# Patient Record
Sex: Female | Born: 1980 | Race: White | Hispanic: No | Marital: Married | Smoking: Never smoker
Health system: Southern US, Community
[De-identification: ages and names within clinical notes are randomized; demographics above are authoritative.]

## PROBLEM LIST (undated history)

## (undated) DIAGNOSIS — K219 Gastro-esophageal reflux disease without esophagitis: Secondary | ICD-10-CM

## (undated) DIAGNOSIS — G43909 Migraine, unspecified, not intractable, without status migrainosus: Secondary | ICD-10-CM

## (undated) DIAGNOSIS — S0340XA Sprain of jaw, unspecified side, initial encounter: Secondary | ICD-10-CM

## (undated) HISTORY — PX: CHOLECYSTECTOMY: SHX55

## (undated) HISTORY — DX: Gastro-esophageal reflux disease without esophagitis: K21.9

## (undated) HISTORY — PX: APPENDECTOMY: SHX54

## (undated) HISTORY — DX: Migraine, unspecified, not intractable, without status migrainosus: G43.909

## (undated) HISTORY — PX: LAPAROSCOPIC HYSTERECTOMY: SHX1926

---

## 2020-05-23 ENCOUNTER — Other Ambulatory Visit: Payer: Self-pay | Admitting: Family Medicine

## 2020-05-23 ENCOUNTER — Other Ambulatory Visit: Payer: Self-pay

## 2020-05-23 ENCOUNTER — Ambulatory Visit
Admission: RE | Admit: 2020-05-23 | Discharge: 2020-05-23 | Disposition: A | Discharge status: Home / Self Care | Payer: BC Managed Care – PPO | Source: Ambulatory Visit | Attending: Family Medicine | Admitting: Family Medicine

## 2020-05-23 DIAGNOSIS — M546 Pain in thoracic spine: Secondary | ICD-10-CM

## 2020-05-23 IMAGING — CR DG THORACIC SPINE 3V
3 series · 3 of 3 positions shown · non-contrast
Comparison: None.

CLINICAL DATA: 38-year-old female with mid back pain for 5 months.
No known injury.

EXAM:
THORACIC SPINE - 3 VIEWS

[w thoracic spine ap]
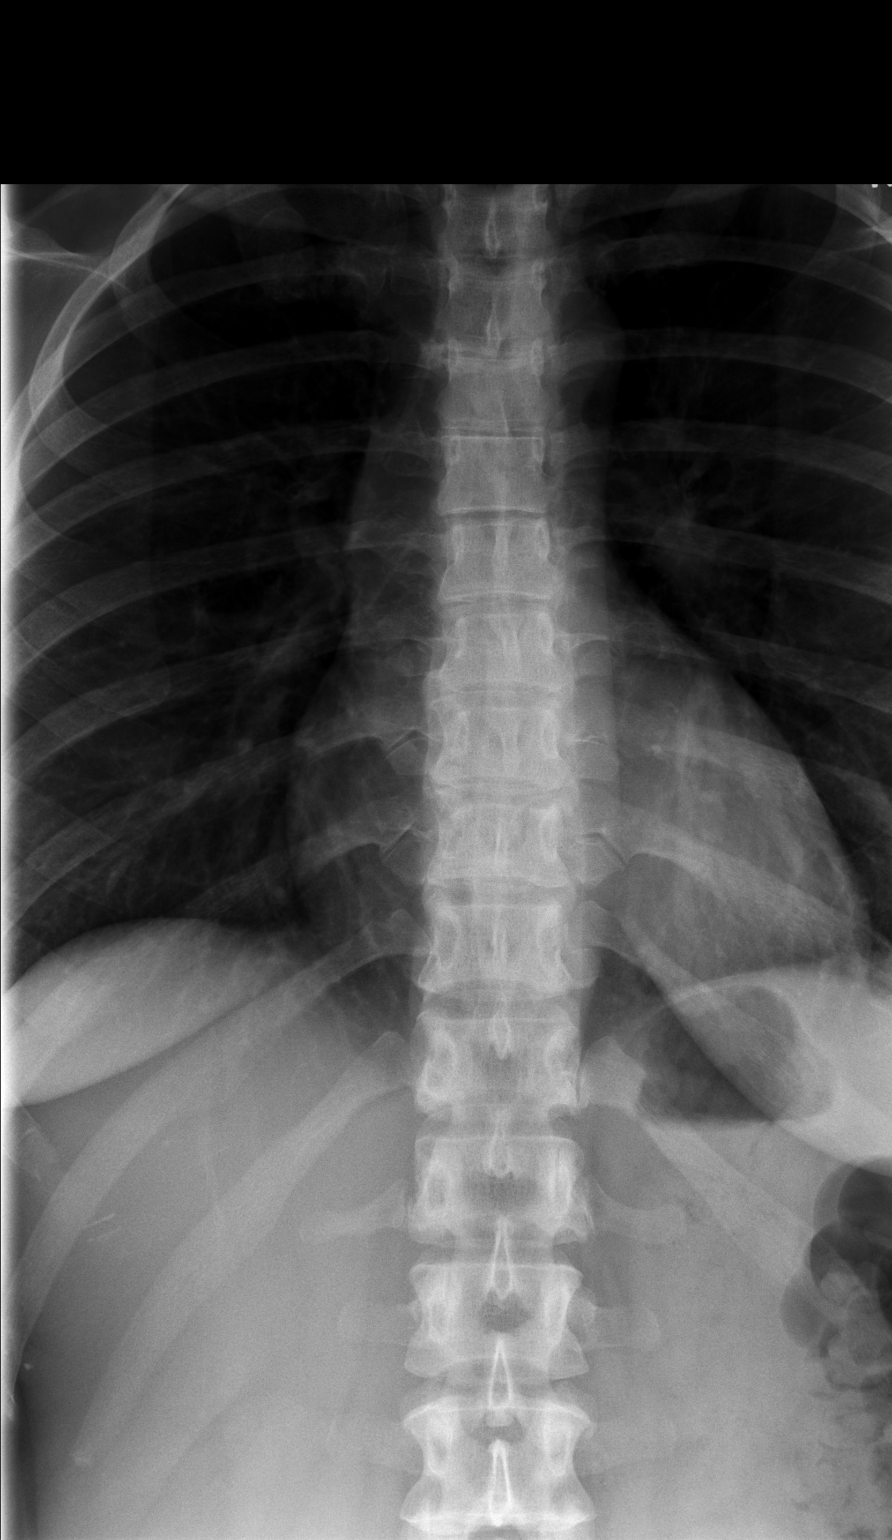

[w thoracic spine lat]
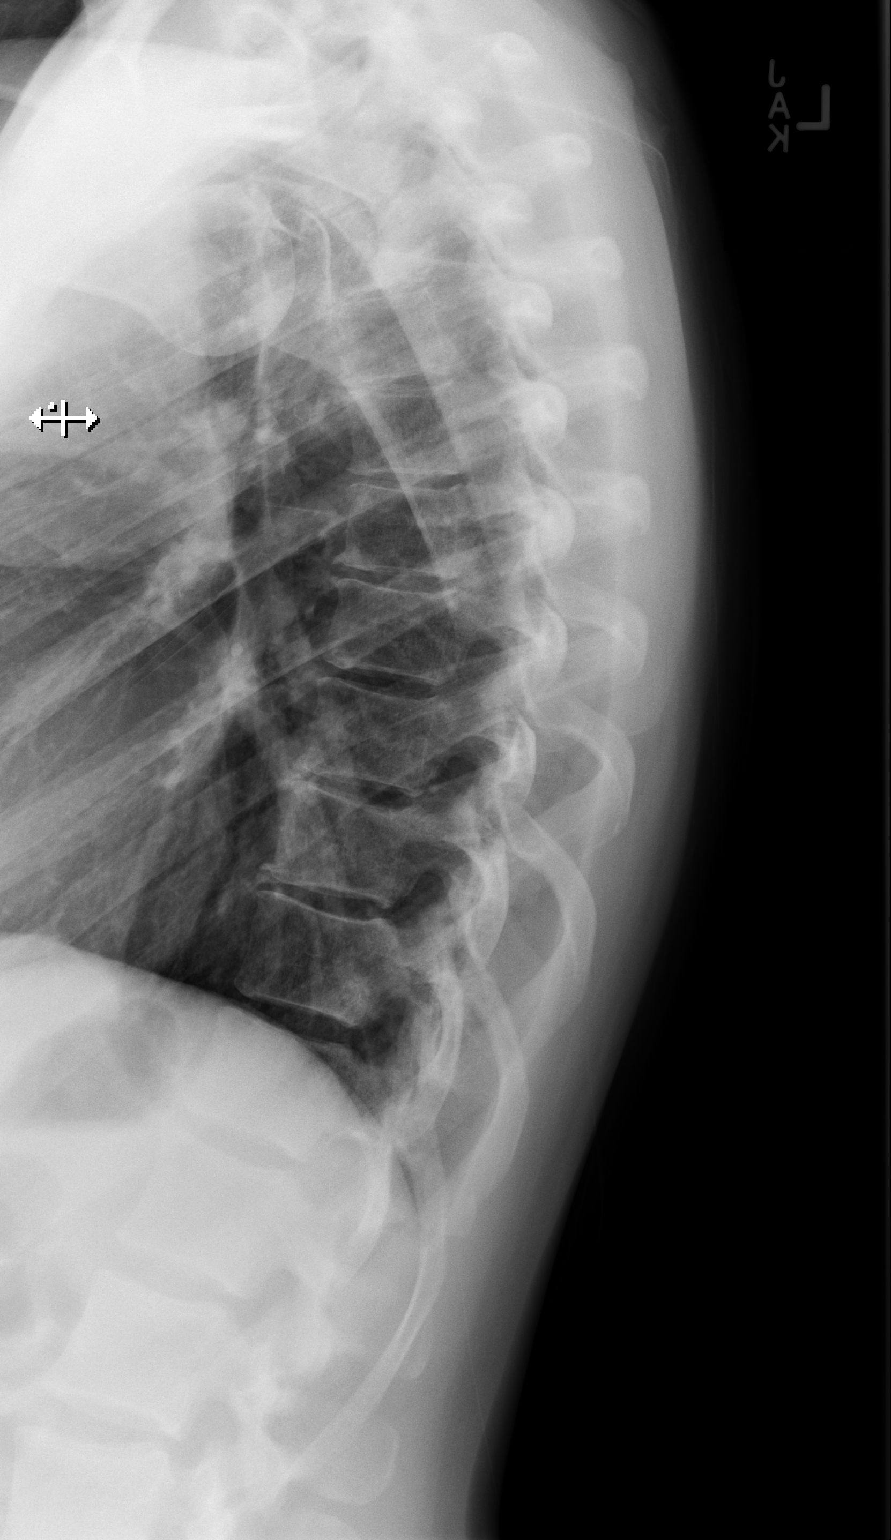

[w thoracic swimmers]
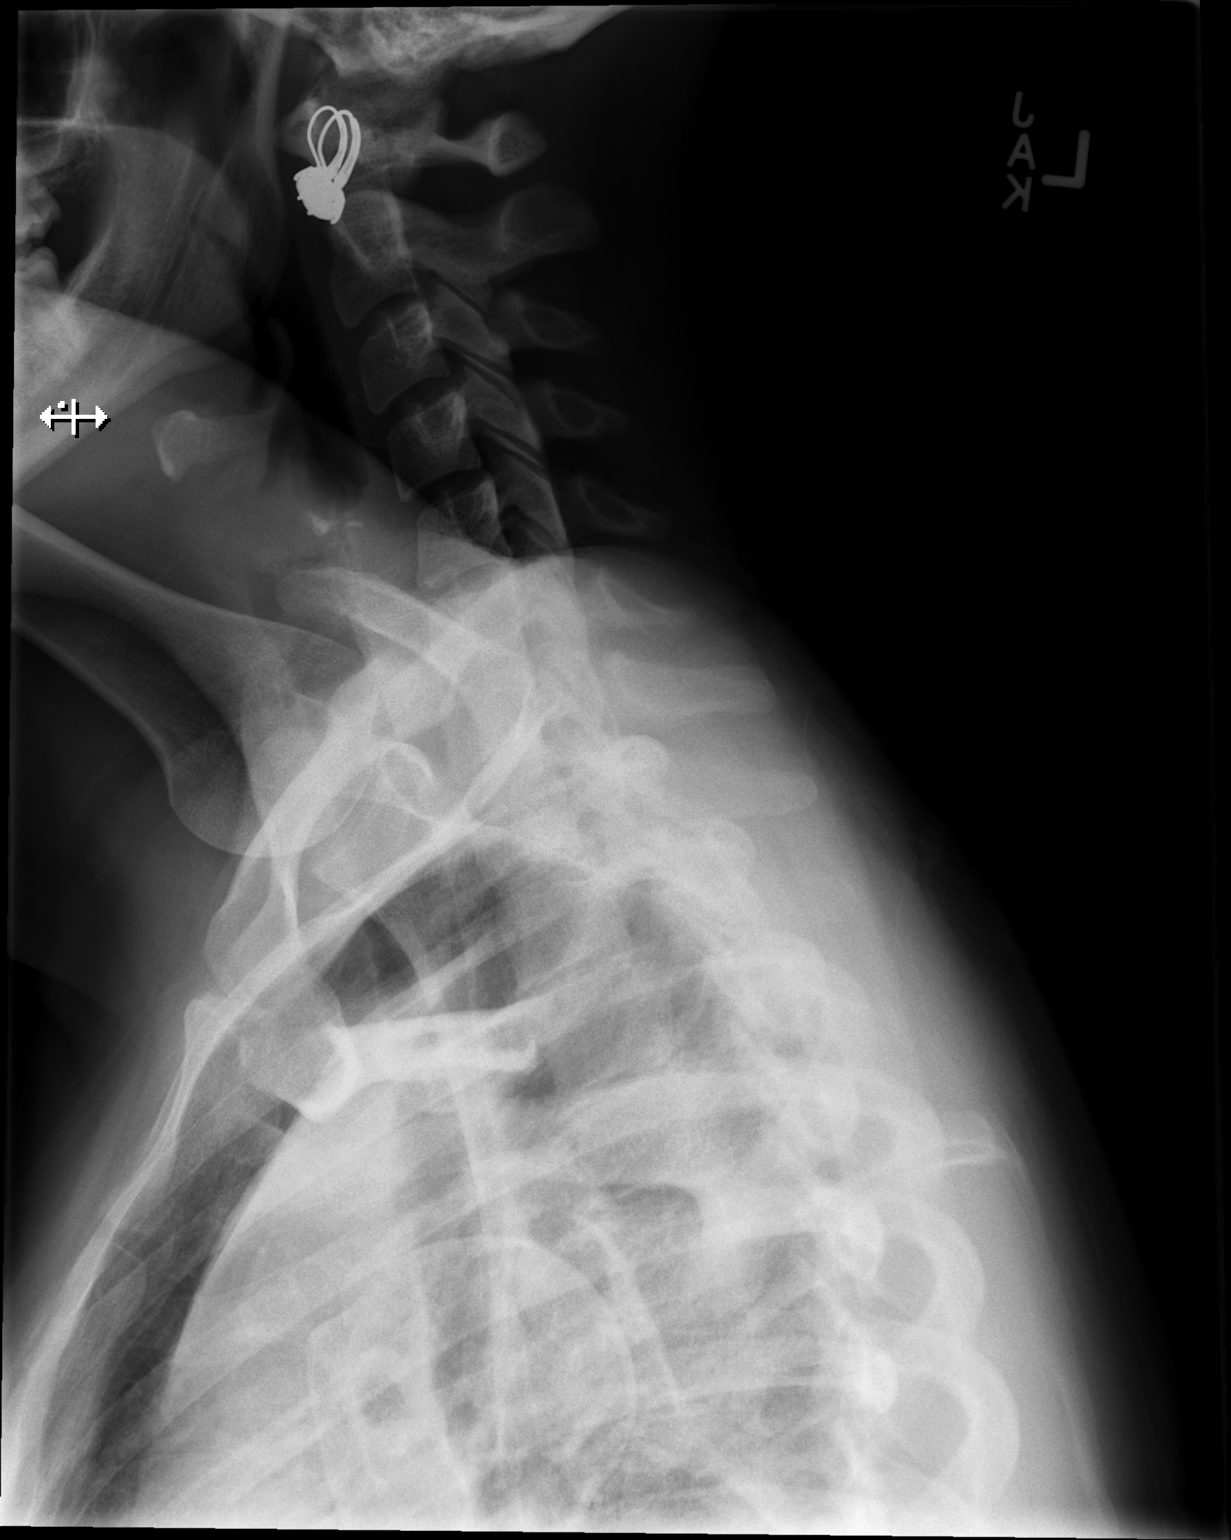

[3 of 3 positions shown; findings below may reference images not displayed]

FINDINGS: Twelve full sized pairs of ribs. Vestigial ribs versus ununited
transverse process ossification centers at L1. Bone mineralization
is within normal limits. Normal thoracic vertebral height and
alignment. Anterior thoracic endplate spurring intermittently noted,
most pronounced at the T9-T10. No acute osseous abnormality
identified. Posterior ribs appear intact. Negative visible chest and
upper abdominal visceral contours.
IMPRESSION: 1. No acute osseous abnormality identified in the thoracic spine.
2. Intermittent anterior thoracic vertebral endplate degeneration
and spurring.

## 2020-06-11 ENCOUNTER — Other Ambulatory Visit: Payer: Self-pay | Admitting: Family Medicine

## 2020-06-11 DIAGNOSIS — R1310 Dysphagia, unspecified: Secondary | ICD-10-CM

## 2020-06-11 DIAGNOSIS — J029 Acute pharyngitis, unspecified: Secondary | ICD-10-CM

## 2020-06-14 ENCOUNTER — Ambulatory Visit
Admission: RE | Admit: 2020-06-14 | Discharge: 2020-06-14 | Disposition: A | Discharge status: Home / Self Care | Payer: BC Managed Care – PPO | Source: Ambulatory Visit | Attending: Family Medicine | Admitting: Family Medicine

## 2020-06-14 DIAGNOSIS — R1310 Dysphagia, unspecified: Secondary | ICD-10-CM

## 2020-06-14 DIAGNOSIS — J029 Acute pharyngitis, unspecified: Secondary | ICD-10-CM

## 2020-06-14 IMAGING — RF DG ESOPHAGUS
8 series · 14 of 24 positions shown · non-contrast
Comparison: None.

CLINICAL DATA: Dysphagia.  History of upper endoscopy.

EXAM:
ESOPHOGRAM / BARIUM SWALLOW / BARIUM TABLET STUDY
TECHNIQUE: Combined double contrast and single contrast examination performed
using effervescent crystals, thick barium liquid, and thin barium
liquid. The patient was observed with fluoroscopy swallowing a 13 mm
barium sulphate tablet.
FLUOROSCOPY TIME:  Fluoroscopy Time: 1 minutes and 0 seconds of
low-dose pulsed fluoroscopy
Radiation Exposure Index (if provided by the fluoroscopic device):
30 mGy
Number of Acquired Spot Images: 0

[Series 1: sequence · 2 of 22 frames shown (1 of 6)]
[frame 4/22]
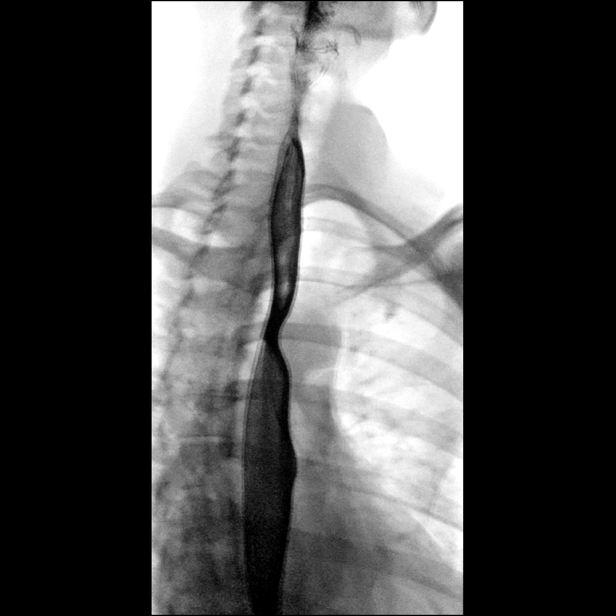
[frame 13/22]
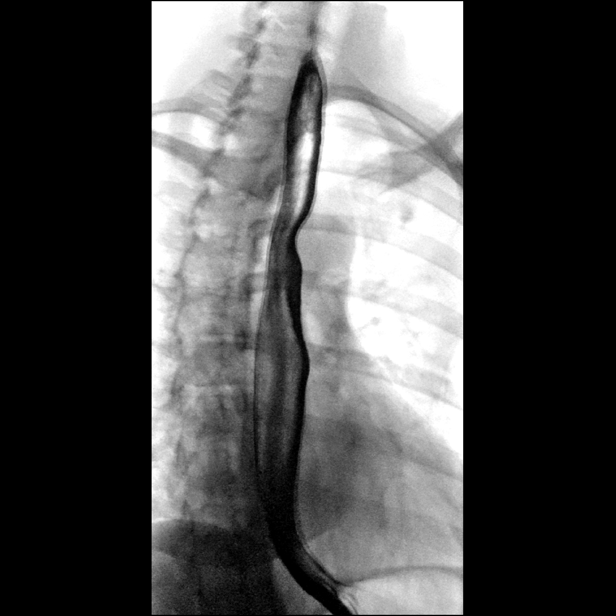

[Series 2: one shot · 1 of 1 slices shown (1 of 2)]
[im 1/1]
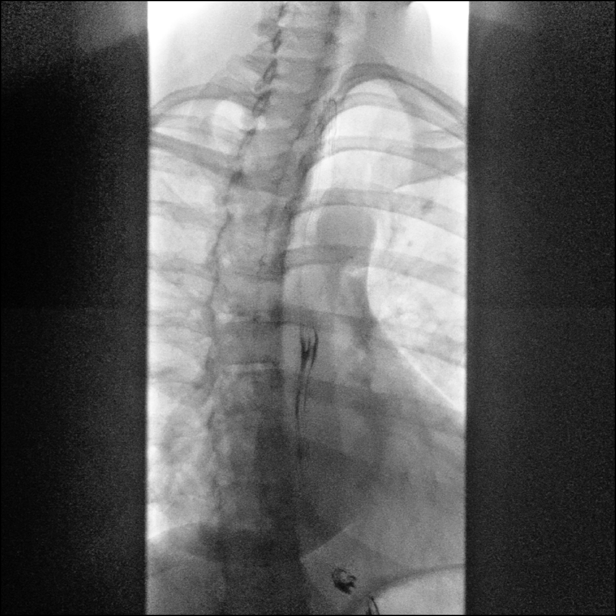

[Series 3: sequence · 1 of 9 frames shown (2 of 6)]
[frame 8/9]
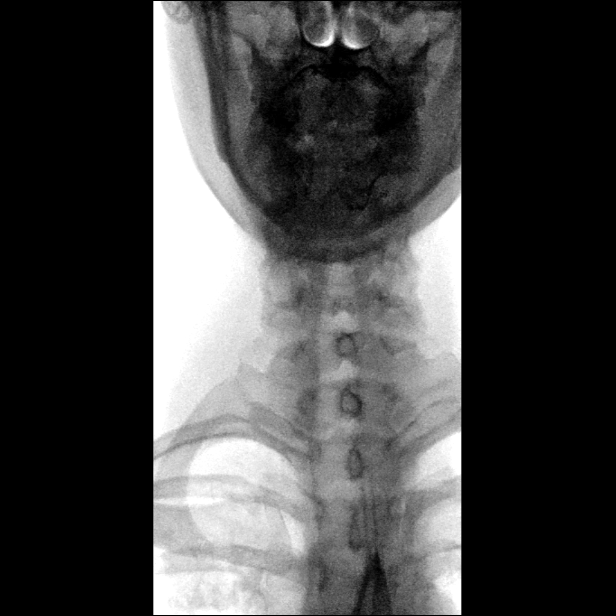

[Series 4: sequence · 2 of 8 frames shown (3 of 6)]
[frame 1/8]
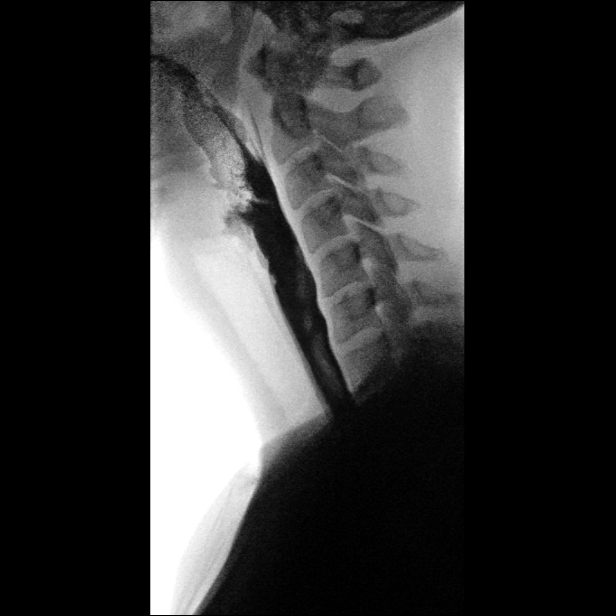
[frame 5/8]
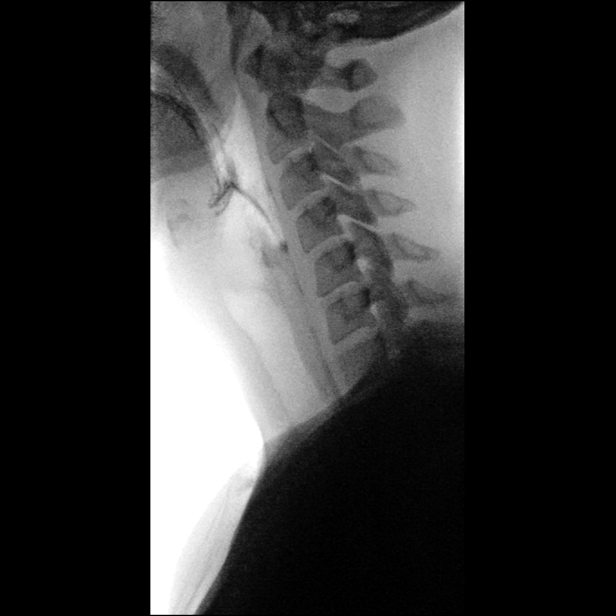

[Series 5: sequence · 3 of 21 frames shown (4 of 6)]
[frame 4/21]
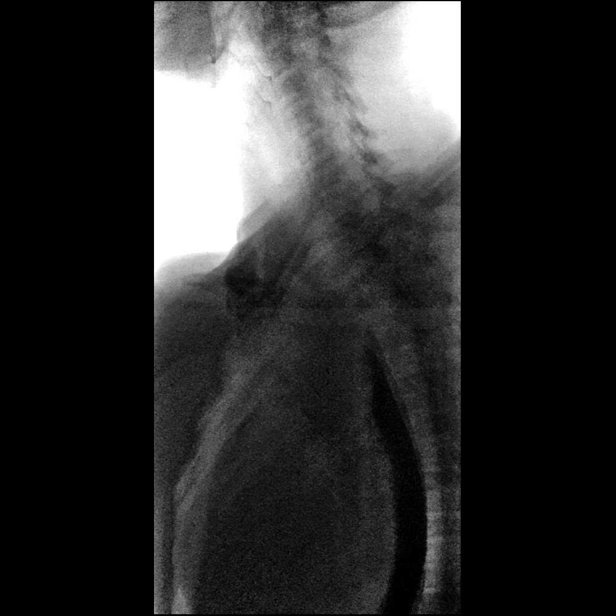
[frame 11/21]
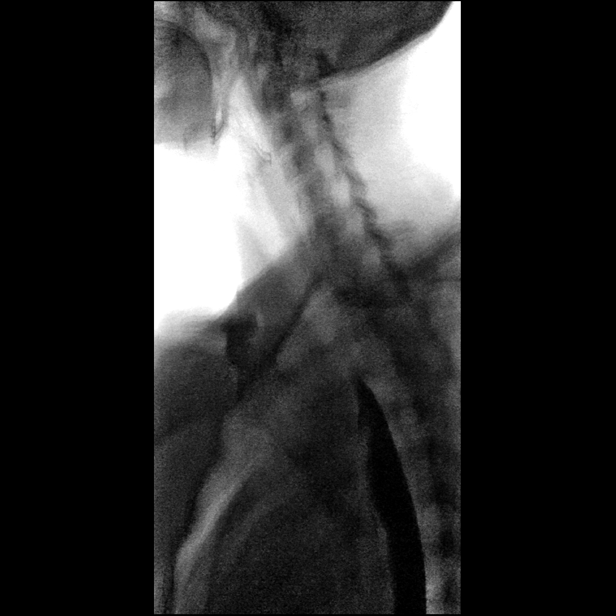
[frame 21/21  full-range]
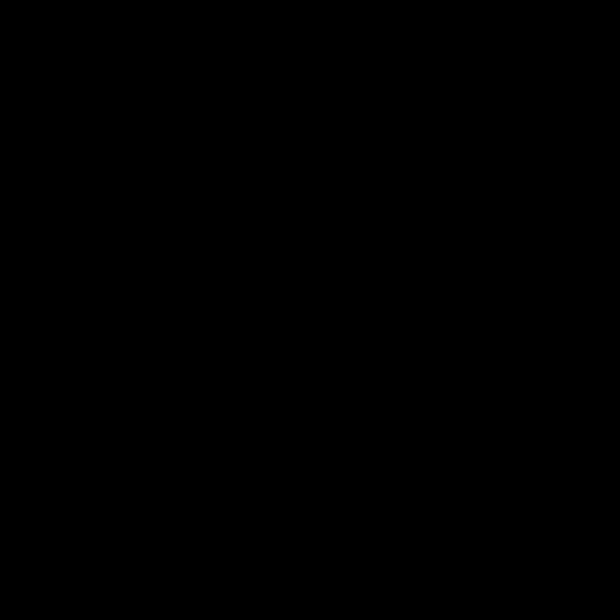

[Series 6: sequence · 1 of 13 frames shown (5 of 6)]
[frame 7/13]
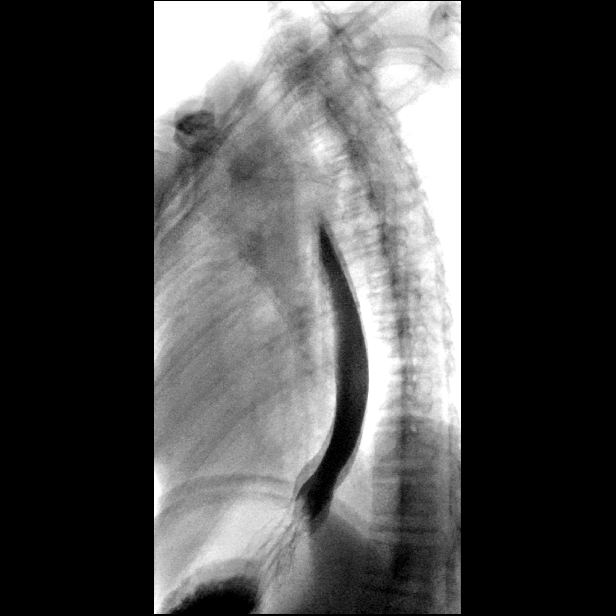

[Series 7: sequence · 3 of 6 frames shown (6 of 6)]
[frame 1/6]
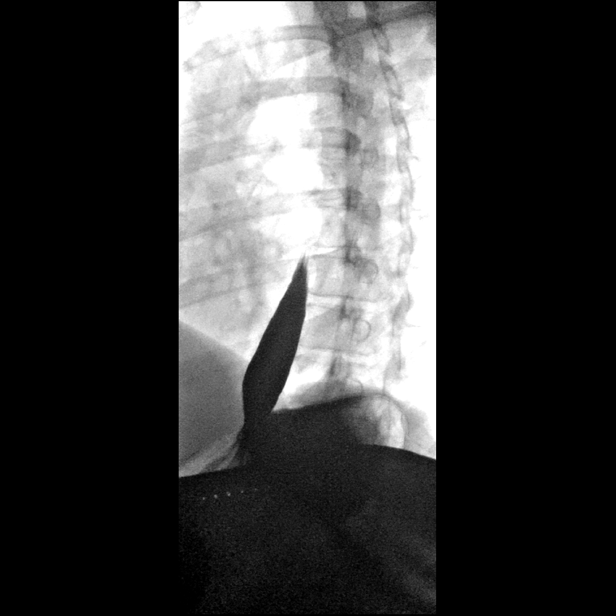
[frame 3/6  full-range]
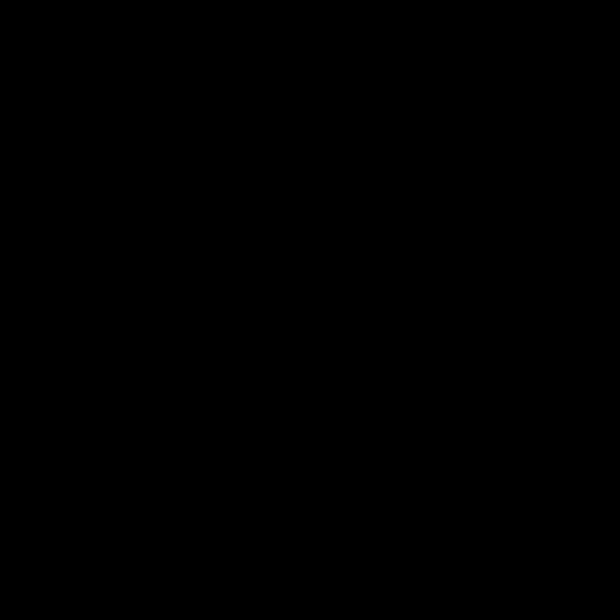
[frame 6/6  full-range]
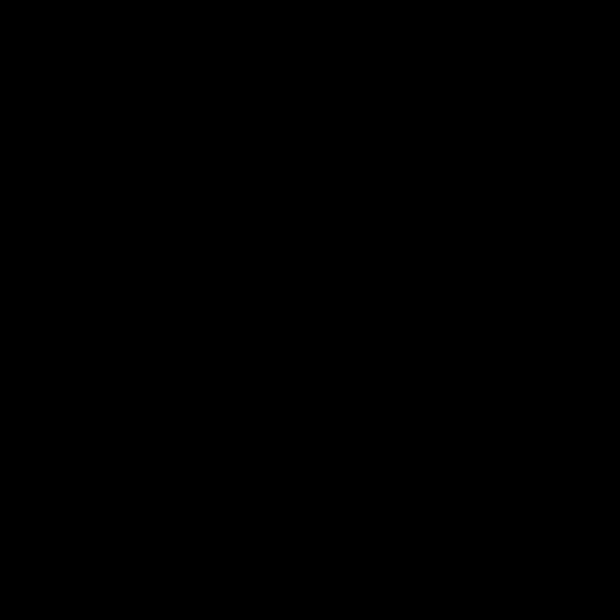

[Series 8: one shot · 1 of 2 slices shown (2 of 2)]
[im 2/2]
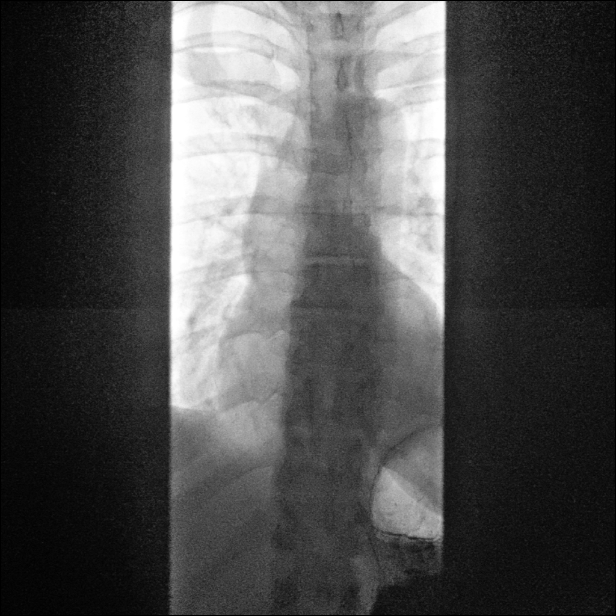

[14 of 24 positions shown; findings below may reference images not displayed]

FINDINGS: The esophageal motility is within normal limits. There is no
evidence of stricture, mass or ulceration. Rapid sequence imaging of
the pharynx in the AP and lateral projections demonstrates no
mucosal abnormalities.

There is no significant hiatal hernia. However, there was
gastroesophageal reflux to the level of the left atrium with the
water siphon test. At the conclusion of the study, a 13 mm barium
tablet was administered. This passed without delay into the stomach.
IMPRESSION: 1. Mild to moderate gastroesophageal reflux.
2. No evidence of esophagitis or stricture.

## 2020-06-20 ENCOUNTER — Telehealth: Payer: Self-pay | Admitting: Gastroenterology

## 2020-06-20 NOTE — Telephone Encounter (Signed)
Hi Dr Adela Lank,   We received a referral from PCP for Dysphagia and Genella Rife. Patient previously had Barium Swallowing Test on 06/14/20 and an EGD on 11/27/19 obtained operative and pathology report for you to review.   Please advise on scheduling.    Thank You!

## 2020-06-26 NOTE — Telephone Encounter (Signed)
Hi.  I had an opening come up tomorrow at 1010. Jan will put her in this spot. Misty Stanley can you see if she can do this? If not can you let everyone know on this message and we can try to find something else. Thanks

## 2020-06-26 NOTE — Telephone Encounter (Signed)
Hi Dr Adela Lank, you reviewed records and recommended an office visit for consult but there are no available appointments with you before the end of the year. Would it be ok to schedule this patient with an  APP?  Please advise, thank you!

## 2020-06-27 ENCOUNTER — Ambulatory Visit: Payer: BC Managed Care – PPO | Admitting: Gastroenterology

## 2020-06-27 ENCOUNTER — Encounter: Payer: Self-pay | Admitting: Gastroenterology

## 2020-06-27 NOTE — Telephone Encounter (Signed)
Called and spoke to pt.  Offered her an appt on 11-1 or 11-2 with Dr. Mervyn Skeeters at 4:00pm (hem banding spot).  She can't make those dates since she is having surgery pn Monday. I asked her if she would be willing to see a PA or NP to get in Sooner and she declined.  She is having reflux Sx but she indicated it is not urgent that she be seen.  She will take Dr. Roberts Gaudy next available appt. Per SA ok to put in banding spot since he has nothing available through December.  She is schedule for 12-7 at 4:00pm. Patient confirmed appt. Misty Stanley - please send new pt letter with paperwork. Thank you

## 2020-07-31 ENCOUNTER — Other Ambulatory Visit: Payer: Self-pay | Admitting: Family Medicine

## 2020-07-31 DIAGNOSIS — R519 Headache, unspecified: Secondary | ICD-10-CM

## 2020-08-06 ENCOUNTER — Encounter: Payer: Self-pay | Admitting: Gastroenterology

## 2020-08-06 ENCOUNTER — Ambulatory Visit: Payer: BC Managed Care – PPO | Admitting: Gastroenterology

## 2020-08-06 ENCOUNTER — Other Ambulatory Visit: Payer: BC Managed Care – PPO

## 2020-08-06 VITALS — BP 138/84 | Ht 66.0 in | Wt 156.0 lb

## 2020-08-06 DIAGNOSIS — R112 Nausea with vomiting, unspecified: Secondary | ICD-10-CM

## 2020-08-06 DIAGNOSIS — R198 Other specified symptoms and signs involving the digestive system and abdomen: Secondary | ICD-10-CM

## 2020-08-06 DIAGNOSIS — R0989 Other specified symptoms and signs involving the circulatory and respiratory systems: Secondary | ICD-10-CM

## 2020-08-06 DIAGNOSIS — K219 Gastro-esophageal reflux disease without esophagitis: Secondary | ICD-10-CM

## 2020-08-06 DIAGNOSIS — R194 Change in bowel habit: Secondary | ICD-10-CM

## 2020-08-06 MED ORDER — FAMOTIDINE 10 MG PO TABS: 10.0000 mg | ORAL_TABLET | Freq: Two times a day (BID) | ORAL | Status: AC

## 2020-08-06 MED ORDER — COLESTIPOL HCL 1 G PO TABS: ORAL_TABLET | ORAL | 1 refills | Status: DC

## 2020-08-06 NOTE — Progress Notes (Signed)
HPI :  39 y/o female with a history of GERD and migraine headaches, referred by by Dr. Mosetta Putt for suspected GERD, globus, vomiting, altered bowel habits.  Patient states she had severe abdominal pain and frequent nausea vomiting back in March.  At that point time she underwent a cholecystectomy in [facility].  Generally since having her cholecystectomy her pain went away and she is been able to eat much better and overall doing better.  She has had some symptoms however that have persisted.  She does have a history of globus, sense of something in her throat.  She denied much pyrosis or regurgitation with this but has been having some intermittent pain in her mid back and some waterbrash overnight.  She was tried on Protonix which did help resolve this symptom but she states she felt "sick" on it and did not like the way it made her feel.  She was then transition to AcipHex.  She states AcipHex likely worked well for her symptoms but she states her migraines got reliably worse on the medication so she also stopped that and self transition to Pepcid 10 mg in the morning a few weeks ago.  She thinks she is tolerating the Pepcid pretty well so far and so far this has resolved her globus.  She denies any pyrosis regurgitation or globus symptoms since being on the regimen.  She denies any dysphagia and is eating well.  One of her main complaints is awakening in the morning with nausea.  She states she feels sick to her stomach when she wakes up but does not vomit.  Over the course the day she states the nausea resolved and then she feels quite well and eats dinner without any problems.  She does not have any vomiting at all.  She really does not have any pain in her abdomen at all.  She does take Excedrin perhaps once or twice a month for headache.  Otherwise denies much use of any NSAIDs otherwise.  She does take a vitamin D supplement.  She had tried Zofran in the past for her symptoms which made her  dizzy.  She was given Reglan when in the hospital in the past and states she became very agitated with that.  She states she is rather sensitive to medications.  Since she has had her cholecystectomy she has had some alteration in her bowels.  She can have loose/yellow stools when she eats fatty foods, often with some urgency.  If she is not eating heavy or fatty foods she does not really have much problems with her bowels.  No blood in her stool she inquires about things she can do to allow her to eat these foods better without causing bowel symptoms.  She has had an extensive work-up for the symptoms since initially seen [facility] this past spring.  She had an EGD and colonoscopy as outlined below without any concerning pathology.  She had normal biopsies of her stomach and colon.  She had a CT scan done back in March which did not show any concerning pathology.  More recently she had a barium swallow on October 15 as below.  There is evidence of spontaneous reflux noted but normal motility no stricture appreciated.  She does endorse having 4 young girls and that can be stressful..  In general she has been doing much better in regards to her cholecystectomy results or abdominal pain.  Her globus is controlled with Pepcid, main issue is nausea  in the morning and altered bowel habits as outlined above when eating heavier foods.  Prior workup: Barium swallow 06/14/20 - normal motility, no stricture / stenosis / mass / ulceration. No significant hiatal hernia, but some "mild to moderate" reflux noted. Barium tablet passed into the stomach, no stricture  CT abdomen / pelvis with contrast 11/29/19 - radioopaque density up to 10cm in length from vaginal cuff to anterior L4-L5 disc, correlate with surgical report for postsurgical change vs. Retained foreign body  Colonoscopy 11/27/19 - normal colon and terminal ileum, internal / external hemorrhoids. Angulated turn in the rectosigmoid colon concerning for  adhesive disease, reached cecum using upper endoscope. Bioosies taken.   EGD 11/27/19 - normal exam- biopsies taken  Path - gastric - mild chronic inflammation, negative for HP and GIM, biopsies of the esophagus normal, biopsies of colon normal   Labs 05/21/20 -  BUN 10, Cr 0.55 T bil 0.7, AP 50, AST 10, ALT 10,  Amylase 28, lipase 9 WBC 6.0, Hgb 13.3, HCT 38.4, plt 197   Past Medical History:  Diagnosis Date  . GERD (gastroesophageal reflux disease)   . Migraines      Past Surgical History:  Procedure Laterality Date  . APPENDECTOMY    . CHOLECYSTECTOMY    . LAPAROSCOPIC HYSTERECTOMY     Family History  Problem Relation Age of Onset  . Diabetes Mother   . Diabetes Father    Social History   Tobacco Use  . Smoking status: Never Smoker  . Smokeless tobacco: Never Used  Substance Use Topics  . Alcohol use: Yes  . Drug use: Never   Current Outpatient Medications  Medication Sig Dispense Refill  . colestipol (COLESTID) 1 g tablet Take 1 gram daily and may increase to twice a day as needed. 90 tablet 1  . famotidine (PEPCID AC) 10 MG tablet Take 1 tablet (10 mg total) by mouth 2 (two) times daily.     No current facility-administered medications for this visit.   Allergies  Allergen Reactions  . Rocephin [Ceftriaxone] Anaphylaxis     Review of Systems: All systems reviewed and negative except where noted in HPI.   Labs per HPI  Physical Exam: BP 138/84   Ht 5\' 6"  (1.676 m)   Wt 156 lb (70.8 kg)   BMI 25.18 kg/m  Constitutional: Pleasant,well-developed, female in no acute distress. HEENT: Normocephalic and atraumatic. Conjunctivae are normal. No scleral icterus. Neck supple.  Cardiovascular: Normal rate, regular rhythm.  Pulmonary/chest: Effort normal and breath sounds normal. No wheezing, rales or rhonchi. Abdominal: Soft, nondistended, nontender. there are no masses palpable.  Extremities: no edema Lymphadenopathy: No cervical adenopathy  noted. Neurological: Alert and oriented to person place and time. Skin: Skin is warm and dry. No rashes noted. Psychiatric: Normal mood and affect. Behavior is normal.   ASSESSMENT AND PLAN: 39 year old female here for new patient assessment of the following:  GERD / globus / nausea - as above, she has a globus as her main presenting symptom of reflux as well as some waterbrash.  She has reliably responded well to antacids to control the symptom but unfortunately has had side effects with 2 PPIs thus far (Protonix and AcipHex).  She has transitioned to Pepcid and taking it once in the morning and that has essentially resolved her globus and she is doing much better in that regard.  Given her response to antacids it is very likely that her globus is caused by reflux.  We discussed options moving  forward.  Unclear what is causing her early AM nausea, perhaps that is related to nocturnal reflux.  We can try increasing her Pepcid to twice daily and see if that helps at all, she seems to tolerate that well.  We discussed trying her on some antiemetics again, she is rather hesitant to try any new medications.  If we are to try anything different in this light I would consider Phenergan nightly which may help her sleep better and prevent morning symptoms.  She wants to hold off on that for now.  We will see how she does on higher dose Pepcid and she can let me know in a few weeks how things are going.  I reassured her that her endoscopy, CT scan, and labs her unremarkable.  Given her nausea and bowel habit changes I will also screen her for celiac disease with labs although anticipate this will be negative given negative endoscopic report of her duodenum previously.  Moving forward, if her symptoms are due to reflux she really does not want to take medications chronically for this.  Hopefully she does well with H2 blocker but counseled her there is risk of tachyphylaxis with this and it may not work as well over  time.  We will see how she does over the next several weeks.  She inquires about alternative ways to manage her reflux, and I discussed TIF with her as well as surgery.  If she wanted to try to manage this without medications long-term, could consider evaluation for TIF, I provided her handout about this but would not recommend it now given she is just started a trial of Pepcid.  She agreed she can follow with me as needed for this issue.   Altered bowel habits post cholecystectomy - discussed how this can happen post cholecystectomy, fortunately it does not happen with everything she eats only heavy / greasy foods.  After discussion of options I will give her some Colestid to use 1 g once to twice daily as needed and see if this will help some of her urgent loose stools after she eats trigger foods.  She can use this as needed and follow-up as needed for this issue  Ileene Patrick, MD [facility]  CC: Mosetta Putt MD

## 2020-08-06 NOTE — Patient Instructions (Signed)
If you are age 37 or older, your body mass index should be between 23-30. Your Body mass index is 25.18 kg/m. If this is out of the aforementioned range listed, please consider follow up with your Primary Care Provider.  If you are age 58 or younger, your body mass index should be between 19-25. Your Body mass index is 25.18 kg/m. If this is out of the aformentioned range listed, please consider follow up with your Primary Care Provider.   Your provider has requested that you go to the basement level for lab work before leaving today. Press "B" on the elevator. The lab is located at the first door on the left as you exit the elevator.  Please take Pepcid 10 MG twice a day.  We have sent the following medications to your pharmacy for you to pick up at your convenience: Colestid 1 gram daily, may increase to twice a day as needed.  It was great seeing you today!  Thank you for entrusting me with your care and choosing [facility].  Dr. Adela Lank    Transoral Incisionless Fundoplication Transoral incisionless fundoplication (TIF) is a procedure to rebuild the valve between the stomach and esophagus. The esophagus is the part of the body that moves food from the mouth to the stomach. TIF is done to treat gastroesophageal reflux disease (GERD), a condition in which acid from your stomach flows up into your esophagus (reflux) and causes heartburn. You may have TIF if lifestyle changes and other medical treatments have not improved your condition. During this procedure, a long, flexible scope with a camera (endoscope) is passed through your mouth to the area where the stomach meets the esophagus. The endoscope projects images onto a screen. TIF does not involve surgical incisions or stitches (sutures). The valve is rebuilt with a device that is inserted through the endoscope. The rebuilt valve prevents reflux. Tell a health care provider about:  Any allergies you have.  All medicines  you are taking, including vitamins, herbs, eye drops, creams, and over-the-counter medicines.  Any problems you or family members have had with anesthetic medicines.  Any blood disorders you have.  Any surgeries you have had.  Any medical conditions you have.  Whether you are pregnant or may be pregnant. What are the risks? Generally, this is a safe procedure. However, problems may occur, including:  Bleeding.  Allergic reactions to medicines or dyes.  Damage to the stomach or esophagus.  Pain in the throat, shoulder, or chest.  Difficulty swallowing.  Gas and bloating.  Nausea and vomiting. Rarely, the procedure fails and does not relieve symptoms. What happens before the procedure? Staying hydrated Follow instructions from your health care provider about hydration, which may include:  Up to 2 hours before the procedure - you may continue to drink clear liquids, such as water, clear fruit juice, black coffee, and plain tea.  Eating and drinking restrictions Follow instructions from your health care provider about eating and drinking, which may include:  8 hours before the procedure - stop eating heavy meals or foods such as meat, fried foods, or fatty foods.  6 hours before the procedure - stop eating light meals or foods, such as toast or cereal.  6 hours before the procedure - stop eating, and drink only clear liquids.  2 hours before the procedure - stop drinking clear liquids. General instructions  Ask your health care provider about: ? Changing or stopping your regular medicines. This is especially important if you  are taking diabetes medicines or blood thinners. ? Taking medicines such as aspirin and ibuprofen. These medicines can thin your blood. Do not take these medicines unless your health care provider tells you to take them. ? Taking over-the-counter medicines, vitamins, herbs, and supplements.  You may have testing, such as: ? Tests to examine your  stomach with an endoscope. ? Imaging studies of your stomach and esophagus. ? Tests to measure acid levels in your stomach and esophagus.  Do not use any products that contain nicotine or tobacco, such as cigarettes and e-cigarettes. Smoking increases GERD and may delay your healing. Try to quit smoking before surgery. If you need help quitting, ask your health care provider.  Plan to have someone take you home from the hospital or clinic.  Plan to have a responsible adult care for you for at least 24 hours after you leave the hospital or clinic. This is important. What happens during the procedure?   To lower your risk of infection, your health care team will wash or sanitize their hands.  An IV will be inserted into one of your veins.  You will be given one or more of the following: ? A medicine to help you relax (sedative). ? A medicine to make you fall asleep (general anesthetic).  After you are asleep, the endoscope will be passed through your mouth and down your esophagus to your stomach.  To guide the movement of the endoscope during the procedure, your surgeon will watch images that are projected from the camera onto a screen.  When the endoscope reaches the opening between the stomach and esophagus, the device that creates a new valve will be inserted through the endoscope.  The device will rebuild the valve by using plastic fasteners to wrap a portion of the upper stomach around the esophagus. This new, narrower valve will prevent reflux.  The device and the endoscope will be removed when the procedure is complete. The procedure may vary among health care providers and hospitals. What happens after the procedure?  Your blood pressure, heart rate, breathing rate, and blood oxygen level will be monitored until the medicines you were given have worn off.  You will be given pain medicine.  You will start on a liquid diet at first.  You will be given home care instructions  when you are able to go home. Summary  TIF is a procedure to treat gastroesophageal reflux disease (GERD).  In this procedure, the valve between your stomach and esophagus is rebuilt to prevent stomach acid from flowing up into your esophagus (reflux).  This procedure is done using an endoscope, and it does not require incisions or stitches (sutures). This information is not intended to replace advice given to you by your health care provider. Make sure you discuss any questions you have with your health care provider. Document Revised: 07/05/2018 Document Reviewed: 06/21/2017 Elsevier Patient Education  2020 [employer].

## 2020-08-07 LAB — IGA: Immunoglobulin A: 283 mg/dL (ref 47–310)

## 2020-08-07 LAB — TISSUE TRANSGLUTAMINASE, IGA: (tTG) Ab, IgA: <1 U/mL

## 2020-09-04 ENCOUNTER — Other Ambulatory Visit: Payer: BC Managed Care – PPO

## 2020-10-06 ENCOUNTER — Ambulatory Visit
Admission: RE | Admit: 2020-10-06 | Discharge: 2020-10-06 | Disposition: A | Discharge status: Home / Self Care | Payer: BC Managed Care – PPO | Source: Ambulatory Visit | Attending: Family Medicine | Admitting: Family Medicine

## 2020-10-06 ENCOUNTER — Other Ambulatory Visit: Payer: Self-pay

## 2020-10-06 DIAGNOSIS — R519 Headache, unspecified: Secondary | ICD-10-CM

## 2020-10-06 IMAGING — MR MR HEAD WO/W CM
12 series · 48 of 48 positions shown · IV contrast (multihance)
Comparison: None.

CLINICAL DATA: Increasingly frequent headaches.

EXAM:
MRI HEAD WITHOUT AND WITH CONTRAST
TECHNIQUE: Multiplanar, multiecho pulse sequences of the brain and surrounding
structures were obtained without and with intravenous contrast.
CONTRAST:  14mL MULTIHANCE GADOBENATE DIMEGLUMINE 529 MG/ML IV SOLN

[Series 2: T1 · sagittal · 5.0mm · 0.45mm/px · 1 of 25 slices shown]
[im 1/25]
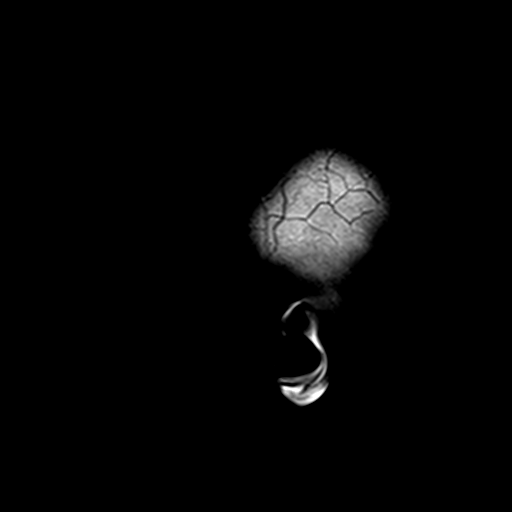

[Series 3: DWI · coronal · 5.0mm · 1.80mm/px · 4 of 72 slices shown (1 of 4)]
[im 1/72]
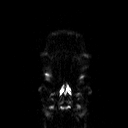
[im 24/72]
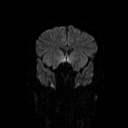
[im 48/72]
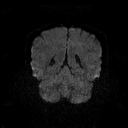
[im 72/72]
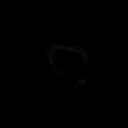

[Series 4: DWI · coronal · 5.0mm · 1.80mm/px · 2 of 37 slices shown (2 of 4)]
[im 1/37]
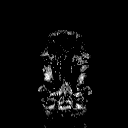
[im 37/37]
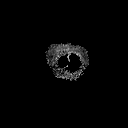

[Series 5: DWI · axial · 3.0mm · 1.88mm/px · z∈[-83,+69]mm · 7 of 104 slices shown (3 of 4)]
[im 1/104]
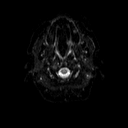
[im 18/104]
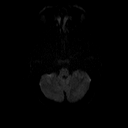
[im 35/104]
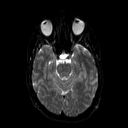
[im 52/104]
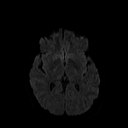
[im 69/104]
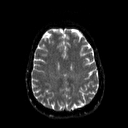
[im 86/104]
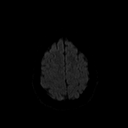
[im 104/104]
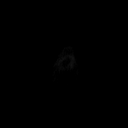

[Series 6: DWI · axial · 3.0mm · 1.88mm/px · z∈[-83,+69]mm · 3 of 52 slices shown (4 of 4)]
[im 1/52]
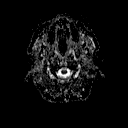
[im 26/52]
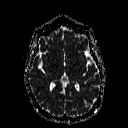
[im 52/52]
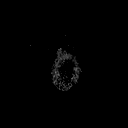

[Series 7: T2 · axial · 5.0mm · 0.94mm/px · z∈[-84,+70]mm · 2 of 24 slices shown (1 of 2)]
[im 1/24]
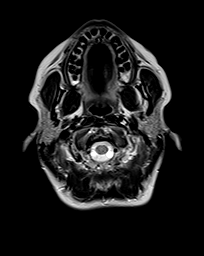
[im 24/24]
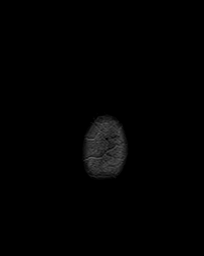

[Series 8: FLAIR · axial · 3.0mm · 0.47mm/px · z∈[-90,+67]mm · 2 of 35 slices shown]
[im 1/35]
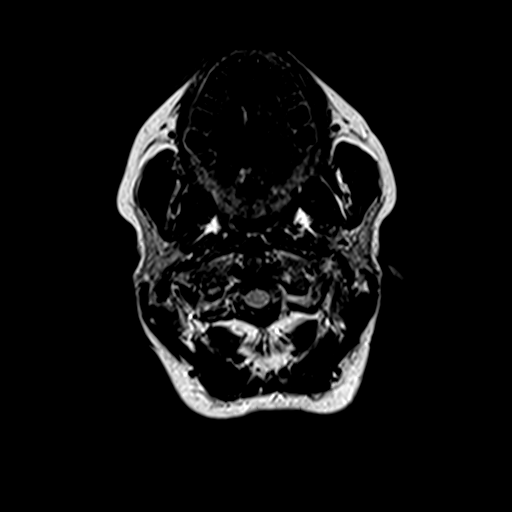
[im 35/35]
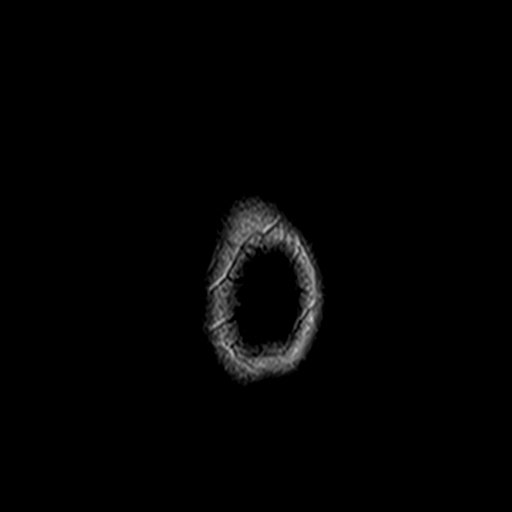

[Series 10: swi_images · axial · 4.0mm · 0.94mm/px · z∈[-88,+67]mm · 3 of 40 slices shown]
[im 1/40]
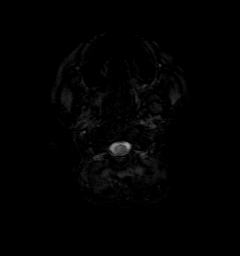
[im 20/40]
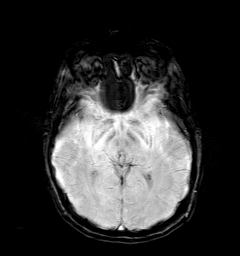
[im 40/40]
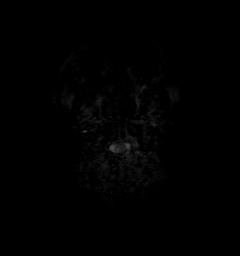

[Series 11: t1_mpr_tra · axial · 1.0mm · 0.94mm/px · z∈[-91,+67]mm · 10 of 160 slices shown]
[im 1/160]
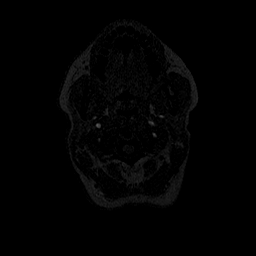
[im 18/160]
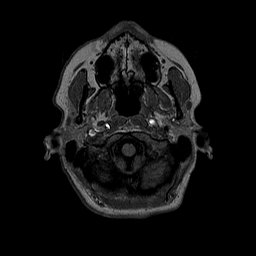
[im 36/160]
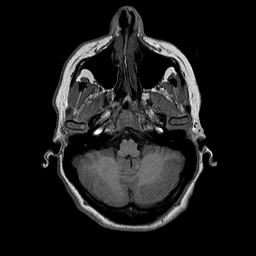
[im 54/160]
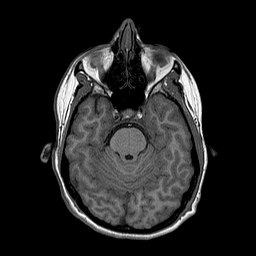
[im 71/160]
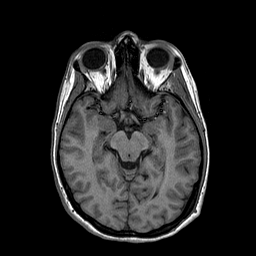
[im 89/160]
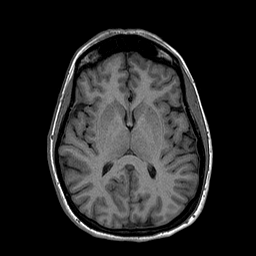
[im 107/160]
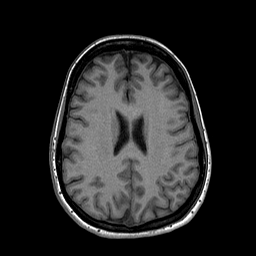
[im 124/160]
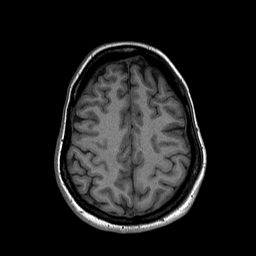
[im 142/160]
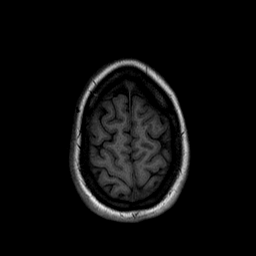
[im 160/160]
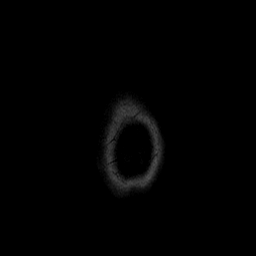

[Series 12: T2 · coronal · 5.0mm · 0.45mm/px · 2 of 29 slices shown (2 of 2)]
[im 1/29]
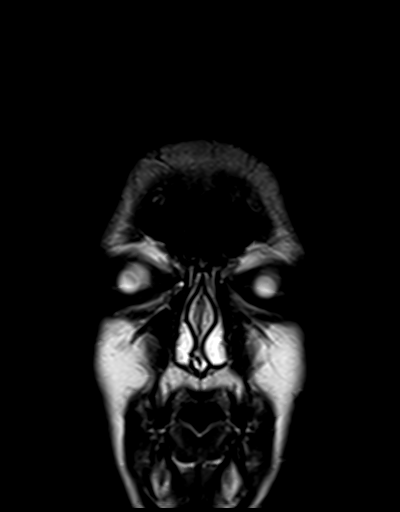
[im 29/29]
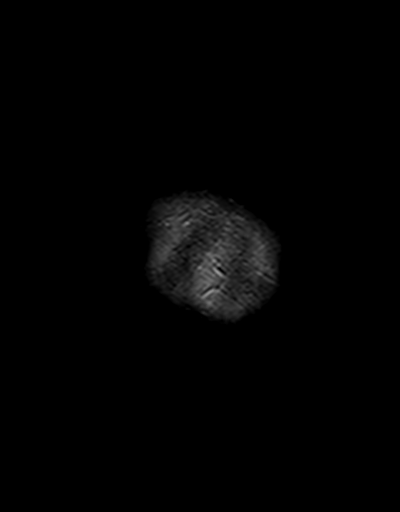

[Series 13: t1_mpr_tra post · axial · 1.0mm · 0.94mm/px · z∈[-91,+67]mm · 10 of 160 slices shown]
[im 1/160]
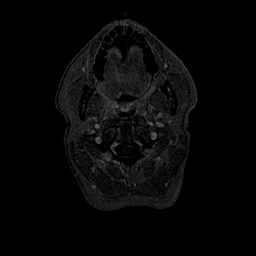
[im 18/160]
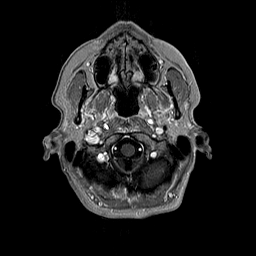
[im 36/160]
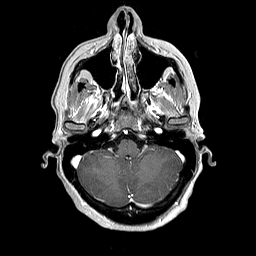
[im 54/160]
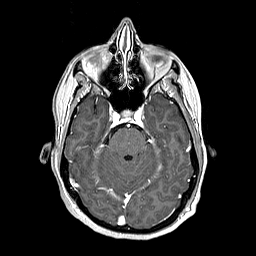
[im 71/160]
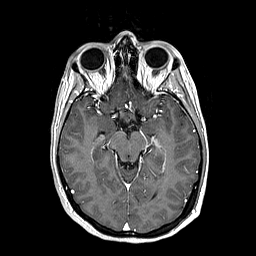
[im 89/160]
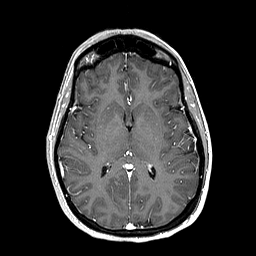
[im 107/160]
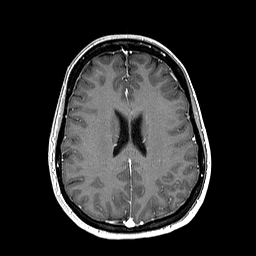
[im 124/160]
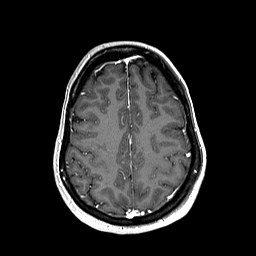
[im 142/160]
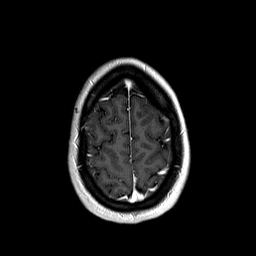
[im 160/160]
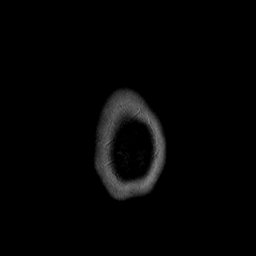

[Series 14: post cor · coronal · 5.0mm · 0.45mm/px · 2 of 29 slices shown]
[im 1/29]
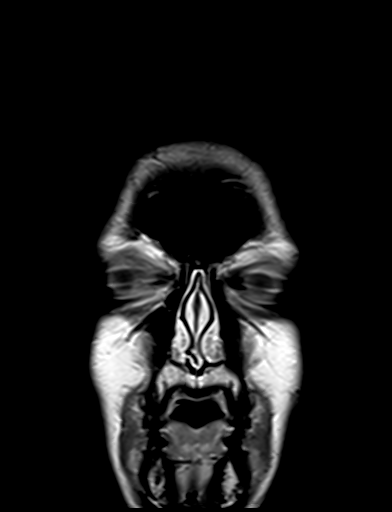
[im 29/29]
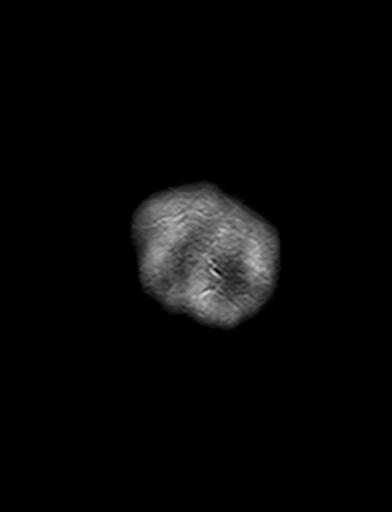

[48 of 48 positions shown; findings below may reference images not displayed]

FINDINGS: Brain: No acute infarction, hemorrhage, hydrocephalus, extra-axial
collection or mass lesion. Normal position of the cerebellar
tonsils. No abnormal enhancement.

Vascular: Major arterial flow voids are maintained at the skull
base.

Skull and upper cervical spine: Normal marrow signal.

Sinuses/Orbits: Clear sinuses.  Unremarkable orbits.

Other: No mastoid effusions.
IMPRESSION: No acute abnormality.

## 2020-10-06 MED ORDER — GADOBENATE DIMEGLUMINE 529 MG/ML IV SOLN
14.0000 mL | Freq: Once | INTRAVENOUS | Status: AC | PRN
  Administered 2020-10-06: 14 mL via INTRAVENOUS

## 2020-10-09 ENCOUNTER — Ambulatory Visit (INDEPENDENT_AMBULATORY_CARE_PROVIDER_SITE_OTHER): Payer: BC Managed Care – PPO | Admitting: Otolaryngology

## 2020-10-23 ENCOUNTER — Other Ambulatory Visit: Payer: Self-pay | Admitting: Obstetrics

## 2020-10-23 DIAGNOSIS — R102 Pelvic and perineal pain: Secondary | ICD-10-CM

## 2020-10-29 ENCOUNTER — Other Ambulatory Visit: Payer: Self-pay

## 2020-10-29 ENCOUNTER — Ambulatory Visit: Payer: BC Managed Care – PPO | Admitting: Orthopaedic Surgery

## 2020-10-29 ENCOUNTER — Ambulatory Visit: Payer: Self-pay

## 2020-10-29 VITALS — Ht 66.0 in | Wt 156.0 lb

## 2020-10-29 DIAGNOSIS — M25551 Pain in right hip: Secondary | ICD-10-CM

## 2020-10-29 NOTE — Progress Notes (Signed)
Office Visit Note   Patient: Lori Sellers           Date of Birth: March 15, 1981           MRN: [id number] Visit Date: 10/29/2020              Requested by: Mosetta Putt, MD [address] [place],  [place] [address] PCP: Mosetta Putt, MD   Assessment & Plan: Visit Diagnoses:  1. Pain in right hip     Plan: Given the mechanical symptoms of her right hip combined with the failure of conservative treatment including physical therapy, a MRI arthrogram of the right hip is warranted to rule out a labral tear and assess the gluteus medius and minimus tendons in the piriformis area of her right hip.  I think this is medically necessary at this point given the long course of pain that she has had over 13 years and given the stiffness on my exam as well as her signs and symptoms and plain film findings combined with the failure of physical therapy and anti-inflammatories as well as activity modification.  We will work on ordering this study and then we will see her back in follow-up after the MRI.  All questions and concerns were answered and addressed.  Follow-Up Instructions: No follow-ups on file.   Orders:  Orders Placed This Encounter  Procedures  . XR HIP UNILAT W OR W/O PELVIS 1V RIGHT   No orders of the defined types were placed in this encounter.     Procedures: No procedures performed   Clinical Data: No additional findings.   Subjective: Chief Complaint  Patient presents with  . Right Hip - Pain  The patient is a very pleasant 40 year old female who comes in with a 13-year history of right hip pain.  There is been no new injury but it started after the birth of one of her kids.  She has 4 daughters.  She is also an avid skier and going skiing in January of this year was very difficult for her with a significantly stiff right hip.  She has pain in the groin and all around the right hip.  It hurts with pivoting activities.  She cannot squat or lunge much because  of pain in the hip and the groin on the right side.  She has been through physical therapy already for this and is worked on activity modification.  She is a thin individual.  She is not a diabetic.  She tried anti-inflammatories as well.  Her pain is consistent with some type of mechanical pain that she is experiencing around her right hip.  Her left hip has no issues.  She denies any back pain and denies any numbness and tingling in her feet.  She denies any knee issues.  She is otherwise a healthy 40 year old female.  HPI  Review of Systems There is currently no headache, chest pain, shortness of breath, fever, chills, nausea, vomiting  Objective: Vital Signs: Ht 5\' 6"  (1.676 m)   Wt 156 lb (70.8 kg)   BMI 25.18 kg/m   Physical Exam She is alert and orient x3 and in no acute distress Ortho Exam Examination of her left hip is normal.  Examination of her right hip shows some stiffness at the extremes of internal and external rotation as well as pain in the groin and pain over the trochanteric area with hip abduction and rotation.  When I have her lay supine her leg lengths are equal.  Specialty Comments:  No specialty comments available.  Imaging: XR HIP UNILAT W OR W/O PELVIS 1V RIGHT  Result Date: 10/29/2020 An AP pelvis and lateral right hip shows no acute findings.  The hip joint space is well-maintained.  There is no irregularities the can be seen over the trochanteric area.    PMFS History: There are no problems to display for this patient.  Past Medical History:  Diagnosis Date  . GERD (gastroesophageal reflux disease)   . Migraines     Family History  Problem Relation Age of Onset  . Diabetes Mother   . Diabetes Father     Past Surgical History:  Procedure Laterality Date  . APPENDECTOMY    . CHOLECYSTECTOMY    . LAPAROSCOPIC HYSTERECTOMY     Social History   Occupational History  . Not on file  Tobacco Use  . Smoking status: Never Smoker  . Smokeless  tobacco: Never Used  Substance and Sexual Activity  . Alcohol use: Yes  . Drug use: Never  . Sexual activity: Not on file

## 2020-10-30 ENCOUNTER — Other Ambulatory Visit: Payer: Self-pay | Admitting: Gastroenterology

## 2020-10-31 ENCOUNTER — Encounter: Payer: Self-pay | Admitting: Unknown Physician Specialty

## 2020-10-31 ENCOUNTER — Other Ambulatory Visit: Payer: Self-pay

## 2020-11-01 ENCOUNTER — Telehealth: Payer: Self-pay

## 2020-11-04 ENCOUNTER — Ambulatory Visit
Admission: RE | Admit: 2020-11-04 | Discharge: 2020-11-04 | Disposition: A | Discharge status: Home / Self Care | Payer: BC Managed Care – PPO | Source: Ambulatory Visit | Attending: Obstetrics | Admitting: Obstetrics

## 2020-11-04 DIAGNOSIS — R102 Pelvic and perineal pain: Secondary | ICD-10-CM

## 2020-11-04 IMAGING — US US PELVIS COMPLETE WITH TRANSVAGINAL
1 series · 14 of 25 positions shown · non-contrast
Comparison: None

CLINICAL DATA: Right hip pain. Pelvic pain. Status post
hysterectomy.



[Series 1: us pelvis complete with transvaginal · 0.25mm/px · 39 acquisitions, 14 frames shown]
[im 1/39]
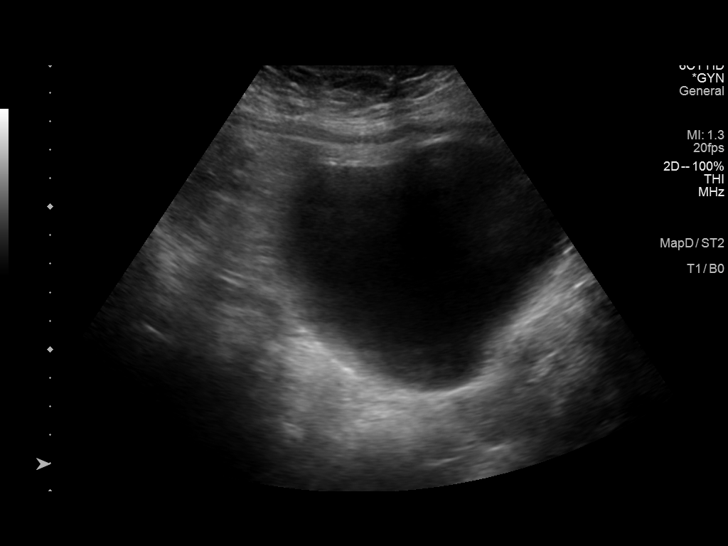
[im 4/39]
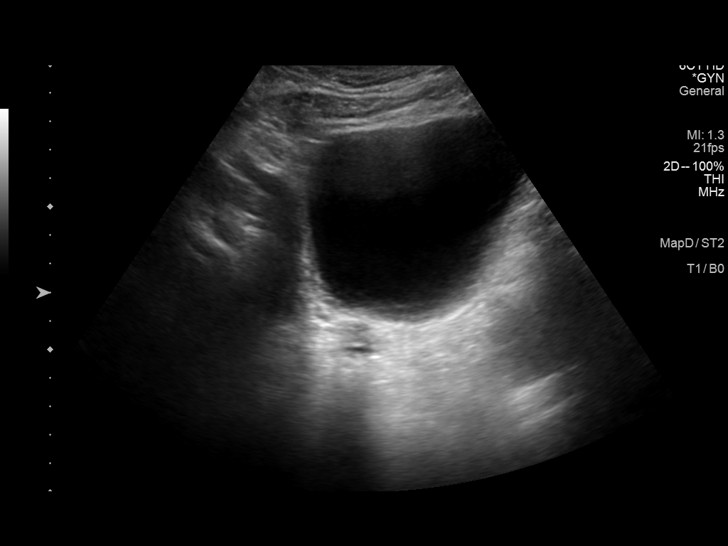
[im 7/39]
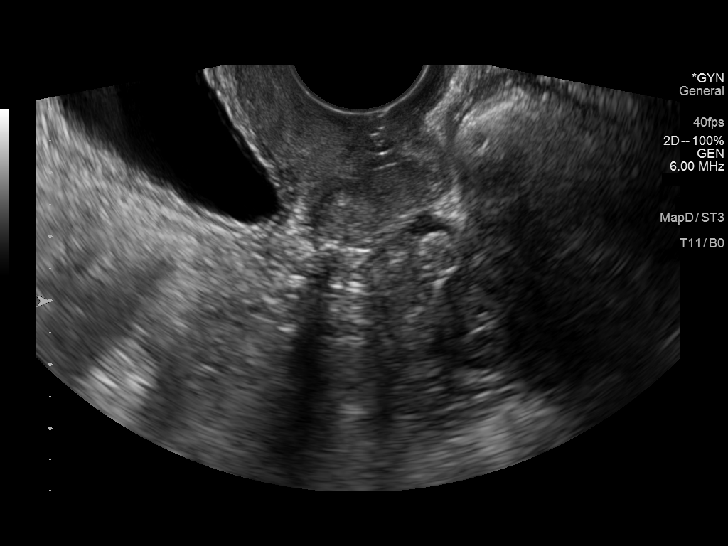
[im 10/39]
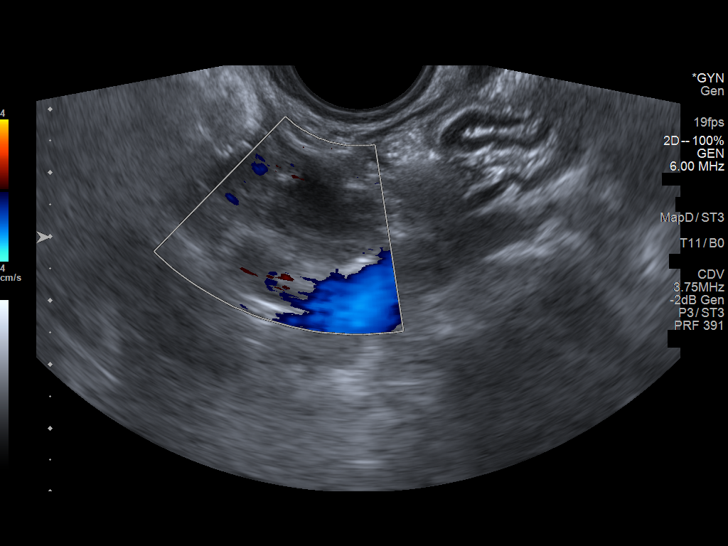
[im 13/39]
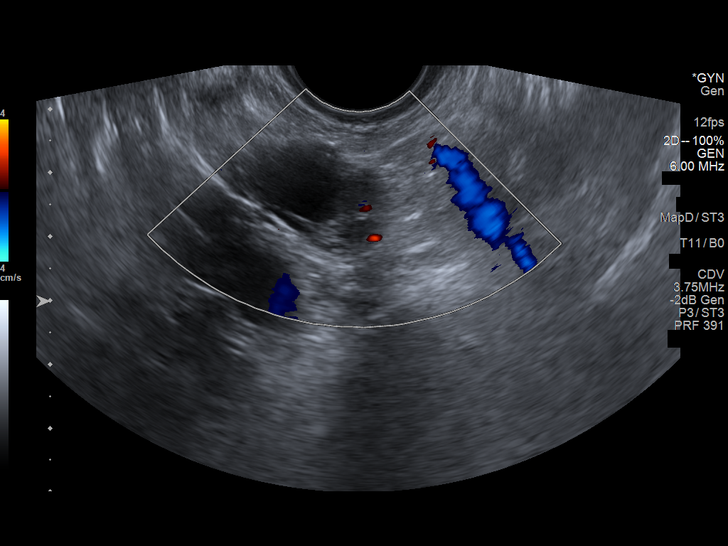
[im 15/39]
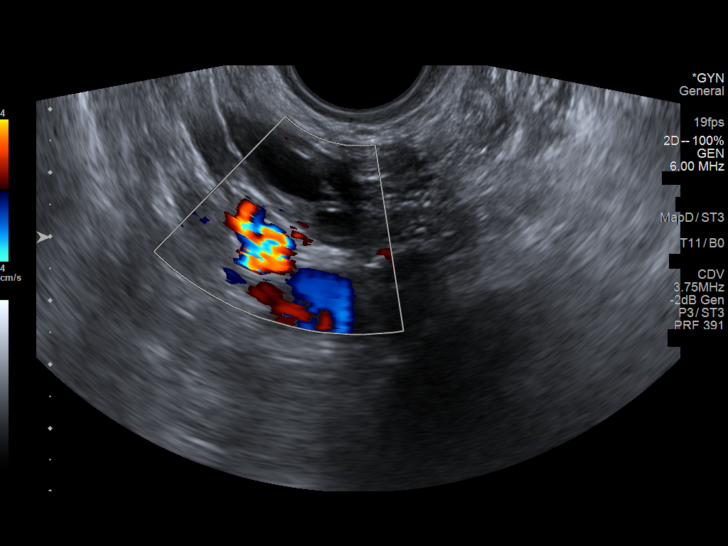
[im 18/39]
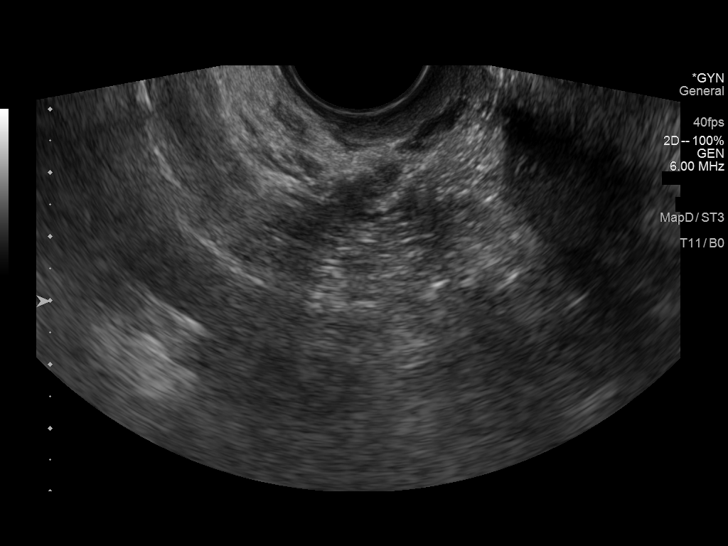
[im 21/39]
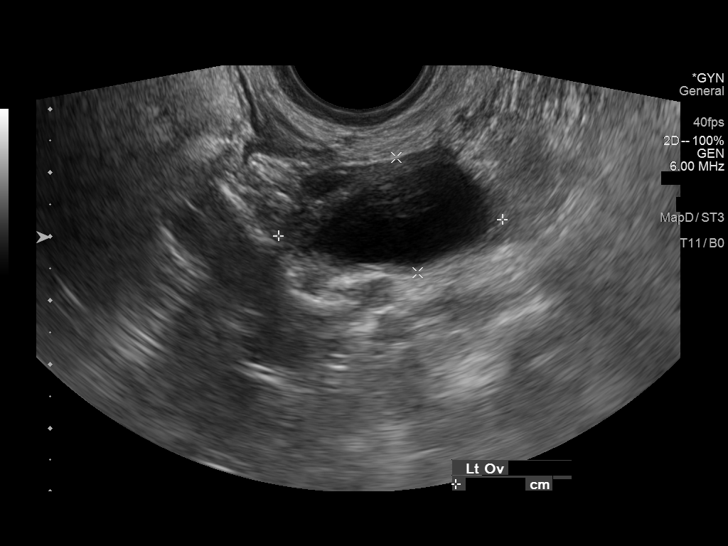
[im 24/39]
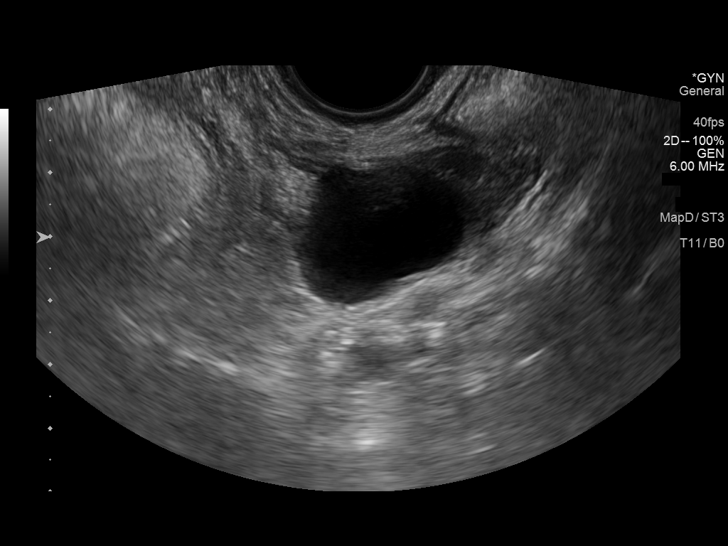
[im 26/39]
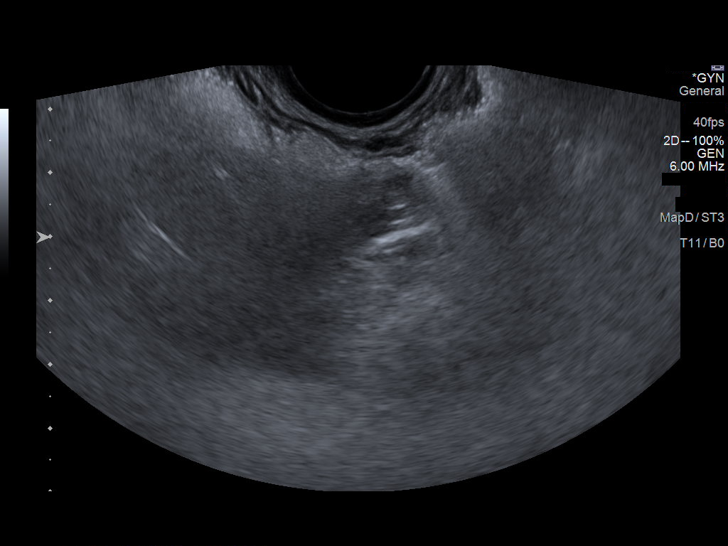
[im 29/39]
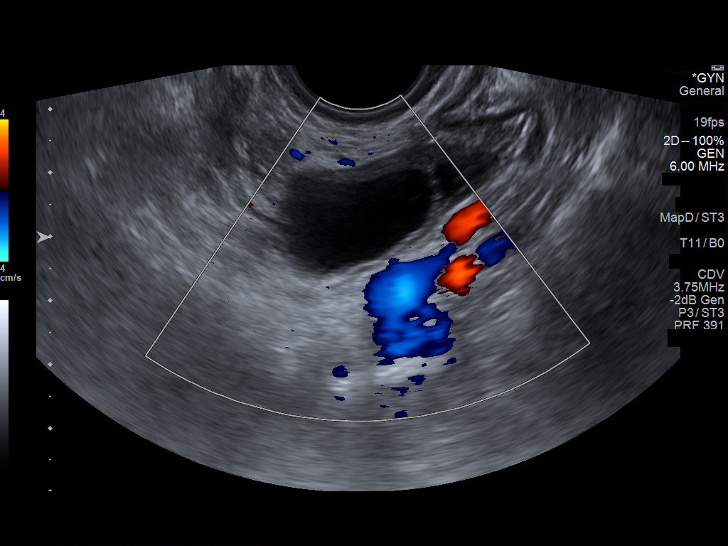
[im 32/39]
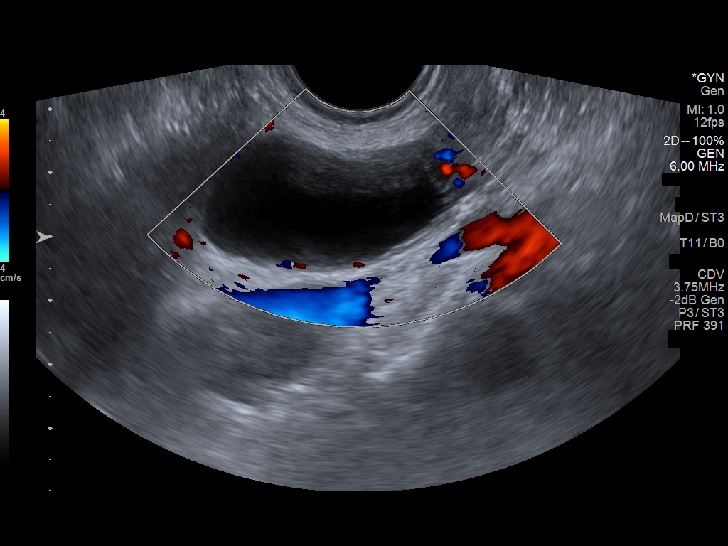
[im 35/39]
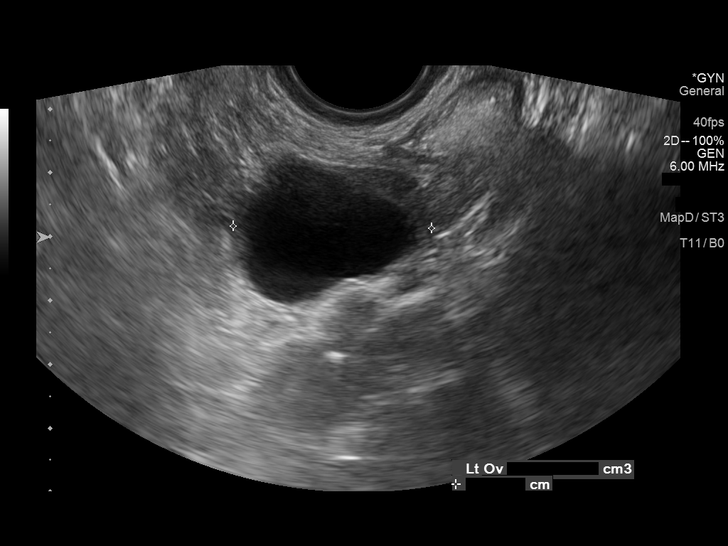
[im 39/39]
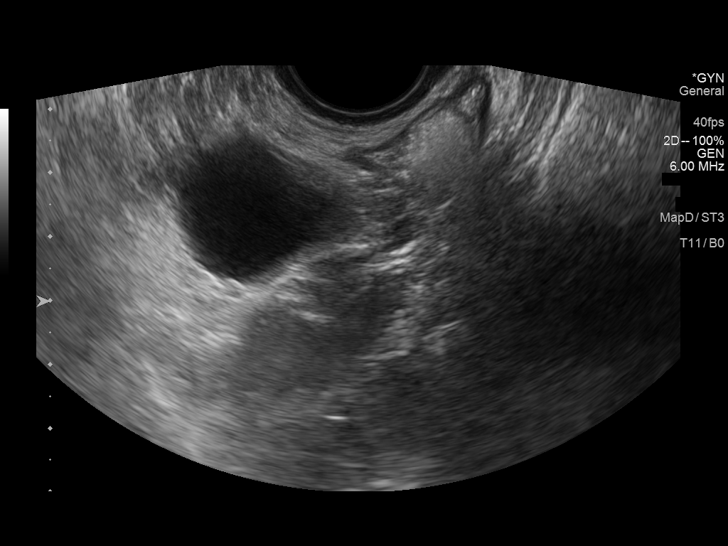

[14 of 25 positions shown; findings below may reference images not displayed]

FINDINGS: Uterus

Surgically absent

Right ovary

Measurements: 3 x 1.5 x 1.4 cm = volume: 3 mL. Normal appearance/no
adnexal mass.

Left ovary

Measurements: 3.5 x 1.8 x 3.1 cm = volume: 10 mL. There is a
probable hemorrhagic cyst involving the left ovary measuring 3.4 x
1.8 x 2.5 cm.

Other findings

No abnormal free fluid.
IMPRESSION: 1. Status post hysterectomy.
2. No acute abnormality.
3. Probable 3.4 cm hemorrhagic cyst involving the left ovary.

## 2020-11-06 ENCOUNTER — Other Ambulatory Visit: Payer: BC Managed Care – PPO | Attending: Unknown Physician Specialty

## 2020-11-08 ENCOUNTER — Ambulatory Visit
Admission: RE | Admit: 2020-11-08 | Discharge: 2020-11-08 | Disposition: A | Discharge status: Home / Self Care | Payer: BC Managed Care – PPO | Attending: Unknown Physician Specialty | Admitting: Unknown Physician Specialty

## 2020-11-08 ENCOUNTER — Encounter
Admission: RE | Disposition: A | Discharge status: Home / Self Care | Payer: Self-pay | Source: Home / Self Care | Attending: Unknown Physician Specialty

## 2020-11-08 ENCOUNTER — Ambulatory Visit: Payer: BC Managed Care – PPO | Admitting: Anesthesiology

## 2020-11-08 ENCOUNTER — Encounter: Payer: Self-pay | Admitting: Unknown Physician Specialty

## 2020-11-08 ENCOUNTER — Other Ambulatory Visit: Payer: Self-pay

## 2020-11-08 DIAGNOSIS — J342 Deviated nasal septum: Secondary | ICD-10-CM | POA: Diagnosis not present

## 2020-11-08 DIAGNOSIS — Z87892 Personal history of anaphylaxis: Secondary | ICD-10-CM | POA: Diagnosis not present

## 2020-11-08 DIAGNOSIS — J3489 Other specified disorders of nose and nasal sinuses: Secondary | ICD-10-CM | POA: Insufficient documentation

## 2020-11-08 DIAGNOSIS — J343 Hypertrophy of nasal turbinates: Secondary | ICD-10-CM | POA: Diagnosis not present

## 2020-11-08 DIAGNOSIS — Z881 Allergy status to other antibiotic agents status: Secondary | ICD-10-CM | POA: Diagnosis not present

## 2020-11-08 HISTORY — DX: Sprain of jaw, unspecified side, initial encounter: S03.40XA

## 2020-11-08 HISTORY — PX: NASAL SEPTOPLASTY W/ TURBINOPLASTY: SHX2070

## 2020-11-08 SURGERY — SEPTOPLASTY, NOSE, WITH NASAL TURBINATE REDUCTION
Anesthesia: General | Site: Nose | Laterality: Bilateral

## 2020-11-08 MED ORDER — SUCCINYLCHOLINE CHLORIDE 20 MG/ML IJ SOLN
INTRAMUSCULAR | Status: DC | PRN
  Administered 2020-11-08: 100 mg via INTRAVENOUS

## 2020-11-08 MED ORDER — MIDAZOLAM HCL 5 MG/5ML IJ SOLN
INTRAMUSCULAR | Status: DC | PRN
  Administered 2020-11-08: 2 mg via INTRAVENOUS

## 2020-11-08 MED ORDER — LIDOCAINE HCL (CARDIAC) PF 100 MG/5ML IV SOSY
PREFILLED_SYRINGE | INTRAVENOUS | Status: DC | PRN
  Administered 2020-11-08: 30 mg via INTRAVENOUS

## 2020-11-08 MED ORDER — BACITRACIN 500 UNIT/GM EX OINT
TOPICAL_OINTMENT | CUTANEOUS | Status: DC | PRN
  Administered 2020-11-08: 1 via TOPICAL

## 2020-11-08 MED ORDER — PHENYLEPHRINE HCL 0.5 % NA SOLN
NASAL | Status: DC | PRN
  Administered 2020-11-08: 30 mL via TOPICAL

## 2020-11-08 MED ORDER — EPHEDRINE SULFATE 50 MG/ML IJ SOLN
INTRAMUSCULAR | Status: DC | PRN
  Administered 2020-11-08: 10 mg via INTRAVENOUS

## 2020-11-08 MED ORDER — FENTANYL CITRATE (PF) 100 MCG/2ML IJ SOLN
INTRAMUSCULAR | Status: DC | PRN
  Administered 2020-11-08 (×2): 50 ug via INTRAVENOUS

## 2020-11-08 MED ORDER — LACTATED RINGERS IV SOLN: INTRAVENOUS | Status: DC

## 2020-11-08 MED ORDER — FENTANYL CITRATE (PF) 100 MCG/2ML IJ SOLN
25.0000 ug | INTRAMUSCULAR | Status: DC | PRN
Start: 2020-11-08 — End: 2020-11-08
  Administered 2020-11-08: 25 ug via INTRAVENOUS

## 2020-11-08 MED ORDER — ONDANSETRON HCL 4 MG/2ML IJ SOLN: 4.0000 mg | Freq: Once | INTRAMUSCULAR | Status: DC | PRN

## 2020-11-08 MED ORDER — ONDANSETRON HCL 4 MG/2ML IJ SOLN
INTRAMUSCULAR | Status: DC | PRN
  Administered 2020-11-08: 4 mg via INTRAVENOUS

## 2020-11-08 MED ORDER — SULFAMETHOXAZOLE-TRIMETHOPRIM 800-160 MG PO TABS: 1.0000 | ORAL_TABLET | Freq: Two times a day (BID) | ORAL | 0 refills | Status: AC

## 2020-11-08 MED ORDER — PROPOFOL 10 MG/ML IV BOLUS
INTRAVENOUS | Status: DC | PRN
  Administered 2020-11-08: 150 mg via INTRAVENOUS

## 2020-11-08 MED ORDER — SCOPOLAMINE 1 MG/3DAYS TD PT72
1.0000 | MEDICATED_PATCH | Freq: Once | TRANSDERMAL | Status: DC
  Administered 2020-11-08: 1.5 mg via TRANSDERMAL

## 2020-11-08 MED ORDER — HYDROCODONE-ACETAMINOPHEN 5-300 MG PO TABS: 1.0000 | ORAL_TABLET | ORAL | 0 refills | Status: AC | PRN

## 2020-11-08 MED ORDER — LIDOCAINE-EPINEPHRINE 1 %-1:100000 IJ SOLN
INTRAMUSCULAR | Status: DC | PRN
  Administered 2020-11-08: 12 mL

## 2020-11-08 MED ORDER — DEXAMETHASONE SODIUM PHOSPHATE 4 MG/ML IJ SOLN
INTRAMUSCULAR | Status: DC | PRN
  Administered 2020-11-08: 4 mg via INTRAVENOUS

## 2020-11-08 MED ORDER — ACETAMINOPHEN 10 MG/ML IV SOLN
1000.0000 mg | Freq: Once | INTRAVENOUS | Status: AC
  Administered 2020-11-08: 1000 mg via INTRAVENOUS

## 2020-11-08 MED ORDER — OXYMETAZOLINE HCL 0.05 % NA SOLN
6.0000 | Freq: Once | NASAL | Status: AC
  Administered 2020-11-08: 6 via NASAL

## 2020-11-08 MED ORDER — GLYCOPYRROLATE 0.2 MG/ML IJ SOLN
INTRAMUSCULAR | Status: DC | PRN
  Administered 2020-11-08: .1 mg via INTRAVENOUS

## 2020-11-08 SURGICAL SUPPLY — 23 items
COAG SUCT 10F 3.5MM HAND CTRL (MISCELLANEOUS) ×2 IMPLANT
DRAPE HEAD BAR (DRAPES) ×2 IMPLANT
DRESSING NASL FOAM PST OP SINU (MISCELLANEOUS) IMPLANT
DRSG NASAL FOAM POST OP SINU (MISCELLANEOUS) ×4
ELECT REM PT RETURN 9FT ADLT (ELECTROSURGICAL) ×2
ELECTRODE REM PT RTRN 9FT ADLT (ELECTROSURGICAL) ×1 IMPLANT
GLOVE SURG ENC MOIS LTX SZ7.5 (GLOVE) ×5 IMPLANT
HANDLE YANKAUER SUCT BULB TIP (MISCELLANEOUS) ×2 IMPLANT
KIT TURNOVER KIT A (KITS) ×2 IMPLANT
NDL HYPO 25GX1X1/2 BEV (NEEDLE) ×1 IMPLANT
NEEDLE HYPO 25GX1X1/2 BEV (NEEDLE) ×2 IMPLANT
PACK ENT CUSTOM (PACKS) ×2 IMPLANT
SPLINT NASAL SEPTAL BLV .50 ST (MISCELLANEOUS) ×1 IMPLANT
SPONGE NEURO XRAY DETECT 1X3 (DISPOSABLE) ×2 IMPLANT
STRAP BODY AND KNEE 60X3 (MISCELLANEOUS) ×2 IMPLANT
SUT CHROMIC 3-0 (SUTURE) ×2
SUT CHROMIC 3-0 KS 27XMFL CR (SUTURE) ×1
SUT ETHILON 3-0 KS 30 BLK (SUTURE) ×2 IMPLANT
SUT PLAIN GUT 4-0 (SUTURE) IMPLANT
SUTURE CHRMC 3-0 KS 27XMFL CR (SUTURE) ×1 IMPLANT
SYR 10ML LL (SYRINGE) ×2 IMPLANT
TOWEL OR 17X26 4PK STRL BLUE (TOWEL DISPOSABLE) ×2 IMPLANT
WATER STERILE IRR 250ML POUR (IV SOLUTION) ×2 IMPLANT

## 2020-11-08 NOTE — H&P (Signed)
The patient's history has been reviewed, patient examined, no change in status, stable for surgery.  Questions were answered to the patients satisfaction.  

## 2020-11-08 NOTE — Anesthesia Procedure Notes (Signed)
Procedure Name: Intubation Date/Time: 11/08/2020 9:28 AM Performed by: Cameron Ali, CRNA Pre-anesthesia Checklist: Patient identified, Emergency Drugs available, Suction available, Patient being monitored and Timeout performed Patient Re-evaluated:Patient Re-evaluated prior to induction Oxygen Delivery Method: Circle system utilized Preoxygenation: Pre-oxygenation with 100% oxygen Induction Type: IV induction Ventilation: Mask ventilation without difficulty Laryngoscope Size: Mac and 2 Grade View: Grade I Tube type: Oral Rae Tube size: 7.5 mm Number of attempts: 1 Placement Confirmation: ETT inserted through vocal cords under direct vision,  positive ETCO2 and breath sounds checked- equal and bilateral Tube secured with: Tape Dental Injury: Teeth and Oropharynx as per pre-operative assessment

## 2020-11-08 NOTE — Discharge Instructions (Signed)
[facility] ENDOSCOPIC SINUS SURGERY [facility]  What is Functional Endoscopic Sinus Surgery?  The Surgery involves making the natural openings of the sinuses larger by removing the bony partitions that separate the sinuses from the nasal cavity.  The natural sinus lining is preserved as much as possible to allow the sinuses to resume normal function after the surgery.  In some patients nasal polyps (excessively swollen lining of the sinuses) may be removed to relieve obstruction of the sinus openings.  The surgery is performed through the nose using lighted scopes, which eliminates the need for incisions on the face.  A septoplasty is a different procedure which is sometimes performed with sinus surgery.  It involves straightening the boy partition that separates the two sides of your nose.  A crooked or deviated septum may need repair if is obstructing the sinuses or nasal airflow.  Turbinate reduction is also often performed during sinus surgery.  The turbinates are bony proturberances from the side walls of the nose which swell and can obstruct the nose in patients with sinus and allergy problems.  Their size can be surgically reduced to help relieve nasal obstruction.  What Can Sinus Surgery Do For Me?  Sinus surgery can reduce the frequency of sinus infections requiring antibiotic treatment.  This can provide improvement in nasal congestion, post-nasal drainage, facial pressure and nasal obstruction.  Surgery will NOT prevent you from ever having an infection again, so it usually only for patients who get infections 4 or more times yearly requiring antibiotics, or for infections that do not clear with antibiotics.  It will not cure nasal allergies, so patients with allergies may still require medication to treat their allergies after surgery. Surgery may improve headaches related to sinusitis, however, some people will continue to  require medication to control sinus headaches related to allergies.  Surgery will do nothing for other forms of headache (migraine, tension or cluster).  What Are the Risks of Endoscopic Sinus Surgery?  Current techniques allow surgery to be performed safely with little risk, however, there are rare complications that patients should be aware of.  Because the sinuses are located around the eyes, there is risk of eye injury, including blindness, though again, this would be quite rare. This is usually a result of bleeding behind the eye during surgery, which puts the vision oat risk, though there are treatments to protect the vision and prevent permanent disrupted by surgery causing a leak of the spinal fluid that surrounds the brain.  More serious complications would include bleeding inside the brain cavity or damage to the brain.  Again, all of these complications are uncommon, and spinal fluid leaks can be safely managed surgically if they occur.  The most common complication of sinus surgery is bleeding from the nose, which may require packing or cauterization of the nose.  Continued sinus have polyps may experience recurrence of the polyps requiring revision surgery.  Alterations of sense of smell or injury to the tear ducts are also rare complications.   What is the Surgery Like, and what is the Recovery?  The Surgery usually takes a couple of hours to perform, and is usually performed under a general anesthetic (completely asleep).  Patients are usually discharged home after a couple of hours.  Sometimes during surgery it is necessary to pack the nose to control bleeding, and the packing is left in place for 24 - 48 hours, and removed by your surgeon.    If a septoplasty was performed during the procedure, there is often a splint placed which must be removed after 5-7 days.   Discomfort: Pain is usually mild to moderate, and can be controlled by prescription pain medication or acetaminophen (Tylenol).   Aspirin, Ibuprofen (Advil, Motrin), or Naprosyn (Aleve) should be avoided, as they can cause increased bleeding.  Most patients feel sinus pressure like they have a bad head cold for several days.  Sleeping with your head elevated can help reduce swelling and facial pressure, as can ice packs over the face.  A humidifier may be helpful to keep the mucous and blood from drying in the nose.   Diet: There are no specific diet restrictions, however, you should generally start with clear liquids and a light diet of bland foods because the anesthetic can cause some nausea.  Advance your diet depending on how your stomach feels.  Taking your pain medication with food will often help reduce stomach upset which pain medications can cause.  Nasal Saline Irrigation: It is important to remove blood clots and dried mucous from the nose as it is healing.  This is done by having you irrigate the nose at least 3 - 4 times daily with a salt water solution.  We recommend using NeilMed Sinus Rinse (available at the drug store).  Fill the squeeze bottle with the solution, bend over a sink, and insert the tip of the squeeze bottle into the nose  of an inch.  Point the tip of the squeeze bottle towards the inside corner of the eye on the same side your irrigating.  Squeeze the bottle and gently irrigate the nose.  If you bend forward as you do this, most of the fluid will flow back out of the nose, instead of down your throat.   The solution should be warm, near body temperature, when you irrigate.   Each time you irrigate, you should use a full squeeze bottle.   Note that if you are instructed to use Nasal Steroid Sprays at any time after your surgery, irrigate with saline BEFORE using the steroid spray, so you do not wash it all out of the nose. Another product, Nasal Saline Gel (such as AYR Nasal Saline Gel) can be applied in each nostril 3 - 4 times daily to moisture the nose and reduce scabbing or crusting.  Bleeding:   Bloody drainage from the nose can be expected for several days, and patients are instructed to irrigate their nose frequently with salt water to help remove mucous and blood clots.  The drainage may be dark red or brown, though some fresh blood may be seen intermittently, especially after irrigation.  Do not blow you nose, as bleeding may occur. If you must sneeze, keep your mouth open to allow air to escape through your mouth.  If heavy bleeding occurs: Irrigate the nose with saline to rinse out clots, then spray the nose 3 - 4 times with Afrin Nasal Decongestant Spray.  The spray will constrict the blood vessels to slow bleeding.  Pinch the lower half of your nose shut to apply pressure, and lay down with your head elevated.  Ice packs over the nose may help as well. If bleeding persists despite these measures, you should notify your doctor.  Do not use the Afrin routinely to control nasal congestion after surgery, as it can result in worsening congestion and may affect healing.     Activity: Return to work varies among patients. Most patients will be   out of work at least 5 - 7 days to recover.  Patient may return to work after they are off of narcotic pain medication, and feeling well enough to perform the functions of their job.  Patients must avoid heavy lifting (over 10 pounds) or strenuous physical for 2 weeks after surgery, so your employer may need to assign you to light duty, or keep you out of work longer if light duty is not possible.  NOTE: you should not drive, operate dangerous machinery, do any mentally demanding tasks or make any important legal or financial decisions while on narcotic pain medication and recovering from the general anesthetic.    Call Your Doctor Immediately if You Have Any of the Following: 1. Bleeding that you cannot control with the above measures 2. Loss of vision, double vision, bulging of the eye or black eyes. 3. Fever over 101 degrees 4. Neck stiffness with  severe headache, fever, nausea and change in mental state. You are always encourage to call anytime with concerns, however, please call with requests for pain medication refills during office hours.  Office Endoscopy: During follow-up visits your doctor will remove any packing or splints that may have been placed and evaluate and clean your sinuses endoscopically.  Topical anesthetic will be used to make this as comfortable as possible, though you may want to take your pain medication prior to the visit.  How often this will need to be done varies from patient to patient.  After complete recovery from the surgery, you may need follow-up endoscopy from time to time, particularly if there is concern of recurrent infection or nasal polyps.  General Anesthesia, Adult, Care After This sheet gives you information about how to care for yourself after your procedure. Your health care provider may also give you more specific instructions. If you have problems or questions, contact your health care provider. What can I expect after the procedure? After the procedure, the following side effects are common:  Pain or discomfort at the IV site.  Nausea.  Vomiting.  Sore throat.  Trouble concentrating.  Feeling cold or chills.  Feeling weak or tired.  Sleepiness and fatigue.  Soreness and body aches. These side effects can affect parts of the body that were not involved in surgery. Follow these instructions at home: For the time period you were told by your health care provider:  Rest.  Do not participate in activities where you could fall or become injured.  Do not drive or use machinery.  Do not drink alcohol.  Do not take sleeping pills or medicines that cause drowsiness.  Do not make important decisions or sign legal documents.  Do not take care of children on your own.   Eating and drinking  Follow any instructions from your health care provider about eating or drinking  restrictions.  When you feel hungry, start by eating small amounts of foods that are soft and easy to digest (bland), such as toast. Gradually return to your regular diet.  Drink enough fluid to keep your urine pale yellow.  If you vomit, rehydrate by drinking water, juice, or clear broth. General instructions  If you have sleep apnea, surgery and certain medicines can increase your risk for breathing problems. Follow instructions from your health care provider about wearing your sleep device: ? Anytime you are sleeping, including during daytime naps. ? While taking prescription pain medicines, sleeping medicines, or medicines that make you drowsy.  Have a responsible adult stay with you for the   time you are told. It is important to have someone help care for you until you are awake and alert.  Return to your normal activities as told by your health care provider. Ask your health care provider what activities are safe for you.  Take over-the-counter and prescription medicines only as told by your health care provider.  If you smoke, do not smoke without supervision.  Keep all follow-up visits as told by your health care provider. This is important. Contact a health care provider if:  You have nausea or vomiting that does not get better with medicine.  You cannot eat or drink without vomiting.  You have pain that does not get better with medicine.  You are unable to pass urine.  You develop a skin rash.  You have a fever.  You have redness around your IV site that gets worse. Get help right away if:  You have difficulty breathing.  You have chest pain.  You have blood in your urine or stool, or you vomit blood. Summary  After the procedure, it is common to have a sore throat or nausea. It is also common to feel tired.  Have a responsible adult stay with you for the time you are told. It is important to have someone help care for you until you are awake and  alert.  When you feel hungry, start by eating small amounts of foods that are soft and easy to digest (bland), such as toast. Gradually return to your regular diet.  Drink enough fluid to keep your urine pale yellow.  Return to your normal activities as told by your health care provider. Ask your health care provider what activities are safe for you. This information is not intended to replace advice given to you by your health care provider. Make sure you discuss any questions you have with your health care provider. Document Revised: 05/02/2020 Document Reviewed: 11/30/2019 Elsevier Patient Education  2021 Elsevier Inc.   Scopolamine skin patches Remove in 72 hrs. Wash hands immediately after removal. What is this medicine? SCOPOLAMINE (skoe POL a meen) is used to prevent nausea and vomiting caused by motion sickness, anesthesia and surgery. This medicine may be used for other purposes; ask your health care provider or pharmacist if you have questions. COMMON BRAND NAME(S): Transderm Scop What should I tell my health care provider before I take this medicine? They need to know if you have any of these conditions:  are scheduled to have a gastric secretion test  glaucoma  heart disease  kidney disease  liver disease  lung or breathing disease, like asthma  mental illness  prostate disease  seizures  stomach or intestine problems  trouble passing urine  an unusual or allergic reaction to scopolamine, atropine, other medicines, foods, dyes, or preservatives  pregnant or trying to get pregnant  breast-feeding How should I use this medicine? This medicine is for external use only. Follow the directions on the prescription label. Wear only 1 patch at a time. Choose an area behind the ear, that is clean, dry, hairless and free from any cuts or irritation. Wipe the area with a clean dry tissue. Peel off the plastic backing of the skin patch, trying not to touch the  adhesive side with your hands. Do not cut the patches. Firmly apply to the area you have chosen, with the metallic side of the patch to the skin and the tan-colored side showing. Once firmly in place, wash your hands well with soap and   water. Do not get this medicine into your eyes. After removing the patch, wash your hands and the area behind your ear thoroughly with soap and water. The patch will still contain some medicine after use. To avoid accidental contact or ingestion by children or pets, fold the used patch in half with the sticky side together and throw away in the trash out of the reach of children and pets. If you need to use a second patch after you remove the first, place it behind the other ear. A special MedGuide will be given to you by the pharmacist with each prescription and refill. Be sure to read this information carefully each time. Talk to your pediatrician regarding the use of this medicine in children. Special care may be needed. Overdosage: If you think you have taken too much of this medicine contact a poison control center or emergency room at once. NOTE: This medicine is only for you. Do not share this medicine with others. What if I miss a dose? This does not apply. This medicine is not for regular use. What may interact with this medicine?  alcohol  antihistamines for allergy cough and cold  atropine  certain medicines for anxiety or sleep  certain medicines for bladder problems like oxybutynin, tolterodine  certain medicines for depression like amitriptyline, fluoxetine, sertraline  certain medicines for stomach problems like dicyclomine, hyoscyamine  certain medicines for Parkinson's disease like benztropine, trihexyphenidyl  certain medicines for seizures like phenobarbital, primidone  general anesthetics like halothane, isoflurane, methoxyflurane, propofol  ipratropium  local anesthetics like lidocaine, pramoxine, tetracaine  medicines that relax  muscles for surgery  phenothiazines like chlorpromazine, mesoridazine, prochlorperazine, thioridazine  narcotic medicines for pain  other belladonna alkaloids This list may not describe all possible interactions. Give your health care provider a list of all the medicines, herbs, non-prescription drugs, or dietary supplements you use. Also tell them if you smoke, drink alcohol, or use illegal drugs. Some items may interact with your medicine. What should I watch for while using this medicine? Limit contact with water while swimming and bathing because the patch may fall off. If the patch falls off, throw it away and put a new one behind the other ear. You may get drowsy or dizzy. Do not drive, use machinery, or do anything that needs mental alertness until you know how this medicine affects you. Do not stand or sit up quickly, especially if you are an older patient. This reduces the risk of dizzy or fainting spells. Alcohol may interfere with the effect of this medicine. Avoid alcoholic drinks. Your mouth may get dry. Chewing sugarless gum or sucking hard candy, and drinking plenty of water may help. Contact your healthcare professional if the problem does not go away or is severe. This medicine may cause dry eyes and blurred vision. If you wear contact lenses, you may feel some discomfort. Lubricating drops may help. See your healthcare professional if the problem does not go away or is severe. If you are going to need surgery, an MRI, CT scan, or other procedure, tell your healthcare professional that you are using this medicine. You may need to remove the patch before the procedure. What side effects may I notice from receiving this medicine? Side effects that you should report to your doctor or health care professional as soon as possible:  allergic reactions like skin rash, itching or hives; swelling of the face, lips, or tongue  blurred vision  changes in  vision  confusion  dizziness    eye pain  fast, irregular heartbeat  hallucinations, loss of contact with reality  nausea, vomiting  pain or trouble passing urine  restlessness  seizures  skin irritation  stomach pain Side effects that usually do not require medical attention (report to your doctor or health care professional if they continue or are bothersome):  drowsiness  dry mouth  headache  sore throat This list may not describe all possible side effects. Call your doctor for medical advice about side effects. You may report side effects to FDA at 1-800-FDA-1088. Where should I keep my medicine? Keep out of the reach of children. Store at room temperature between 20 and 25 degrees C (68 and 77 degrees F). Keep this medicine in the foil package until ready to use. Throw away any unused medicine after the expiration date. NOTE: This sheet is a summary. It may not cover all possible information. If you have questions about this medicine, talk to your doctor, pharmacist, or health care provider.  2021 Elsevier/Gold Standard (2017-11-05 16:14:46)  

## 2020-11-08 NOTE — Anesthesia Preprocedure Evaluation (Signed)
Anesthesia Evaluation  Patient identified by MRN, date of birth, ID band Patient awake    Reviewed: Allergy & Precautions, H&P , NPO status , Patient's Chart, lab work & pertinent test results  Airway Mallampati: II  TM Distance: >3 FB Neck ROM: full    Dental no notable dental hx.    Pulmonary    Pulmonary exam normal breath sounds clear to auscultation       Cardiovascular Normal cardiovascular exam Rhythm:regular Rate:Normal     Neuro/Psych  Headaches,    GI/Hepatic GERD  ,  Endo/Other    Renal/GU      Musculoskeletal   Abdominal   Peds  Hematology   Anesthesia Other Findings   Reproductive/Obstetrics                             Anesthesia Physical Anesthesia Plan  ASA: II  Anesthesia Plan: General ETT   Post-op Pain Management:    Induction:   PONV Risk Score and Plan: 3 and Treatment may vary due to age or medical condition, Ondansetron, Dexamethasone and Scopolamine patch - Pre-op  Airway Management Planned:   Additional Equipment:   Intra-op Plan:   Post-operative Plan:   Informed Consent: I have reviewed the patients History and Physical, chart, labs and discussed the procedure including the risks, benefits and alternatives for the proposed anesthesia with the patient or authorized representative who has indicated his/her understanding and acceptance.     Dental Advisory Given  Plan Discussed with: CRNA  Anesthesia Plan Comments:         Anesthesia Quick Evaluation

## 2020-11-08 NOTE — Transfer of Care (Signed)
Immediate Anesthesia Transfer of Care Note  Patient: Lori Sellers  Procedure(s) Performed: NASAL SEPTOPLASTY WITH TURBINATE REDUCTION (Bilateral Nose)  Patient Location: PACU  Anesthesia Type: General ETT  Level of Consciousness: awake, alert  and patient cooperative  Airway and Oxygen Therapy: Patient Spontanous Breathing and Patient connected to supplemental oxygen  Post-op Assessment: Post-op Vital signs reviewed, Patient's Cardiovascular Status Stable, Respiratory Function Stable, Patent Airway and No signs of Nausea or vomiting  Post-op Vital Signs: Reviewed and stable  Complications: No complications documented.

## 2020-11-08 NOTE — Op Note (Signed)
PREOPERATIVE DIAGNOSIS:  Chronic nasal obstruction.  POSTOPERATIVE DIAGNOSIS:  Chronic nasal obstruction.  SURGEON:  Davina Poke, M.D.  NAME OF PROCEDURE:  1. Nasal septoplasty. 2. Submucous resection of inferior turbinates.  OPERATIVE FINDINGS:  Severe nasal septal deformity, hypertrophy of the inferior turbinates.   DESCRIPTION OF THE PROCEDURE:  Lori Sellers was identified in the holding area and taken to the operating room and placed in the supine position.  After general endotracheal anesthesia was induced, the table was turned 45 degrees and the patient was placed in a semi-Fowler position.  The nose was then topically anesthetized with Lidocaine, cotton pledgets were placed within each nostril. After approximately 5 minutes, this was removed at which time a local anesthetic of 1% Lidocaine 1:100,000 units of Epinephrine was used to inject the inferior turbinates in the nasal septum. A total of 12 ml was used. Examination of the nose showed a severe right nasal septal deformity and tremendous hypertrophied inferior turbinate.  Beginning on the right hand side a hemitransfixion incision was then created on the leading edge of the septum on the right.  A subperichondrial plane was elevated posteriorly on the left and taken back to the perpendicular plate of the ethmoid where subperiosteal plane was elevated posteriorly on the left. A large septal spur was identified on the right hand side impacting on the inferior turbinate.  An inferior rim of cartilage was removed anteriorly with care taken to leave an anterior strut to prevent nasal collapse. With this strut removed the perpendicular plate of the ethmoid was separated from the quadrangular cartilage. The large septal spur was removed.  The septum was then replaced in the midline. Reinspection through each nostril showed excellent reduction of the septal deformity. A left posterior inferior fenestration was then created to allow hematoma  drainage.  With the septoplasty completed, beginning on the left-hand side, a 15 blade was used to incise along the inferior edge of the inferior turbinate. A superior laterally based flap was then elevated. The underlying conchal bone of mucosa was excised using Knight scissors. The flap was then laid back over the turbinate stump and cauterized using suction cautery. In a similar fashion the submucous resection was performed on the right.  With the submucous resection completed bilaterally and no active bleeding, the hemitransfixion incision was then closed using two interrupted 3-0 chromic sutures.  Plastic nasal septal splints were placed within each nostril and affixed to the septum using a 3-0 nylon suture. Stammberger was then used beneath each inferior turbinate for hemostasis.    The patient tolerated the procedure well, was returned to anesthesia, extubated in the operating room, and taken to the recovery room in stable condition.    CULTURES:  None.  SPECIMENS:  None.  ESTIMATED BLOOD LOSS:  25 cc.  Davina Poke  11/08/2020  10:08 AM

## 2020-11-08 NOTE — Anesthesia Postprocedure Evaluation (Signed)
Anesthesia Post Note  Patient: Lori Sellers  Procedure(s) Performed: NASAL SEPTOPLASTY WITH TURBINATE REDUCTION (Bilateral Nose)     Patient location during evaluation: PACU Anesthesia Type: General Level of consciousness: awake and alert and oriented Pain management: satisfactory to patient Vital Signs Assessment: post-procedure vital signs reviewed and stable Respiratory status: spontaneous breathing, nonlabored ventilation and respiratory function stable Cardiovascular status: blood pressure returned to baseline and stable Postop Assessment: Adequate PO intake and No signs of nausea or vomiting Anesthetic complications: no   No complications documented.  [place]

## 2020-11-22 ENCOUNTER — Other Ambulatory Visit: Payer: Self-pay

## 2020-11-22 ENCOUNTER — Ambulatory Visit
Admission: RE | Admit: 2020-11-22 | Discharge: 2020-11-22 | Disposition: A | Discharge status: Home / Self Care | Payer: BC Managed Care – PPO | Source: Ambulatory Visit | Attending: Orthopaedic Surgery | Admitting: Orthopaedic Surgery

## 2020-11-22 DIAGNOSIS — M25551 Pain in right hip: Secondary | ICD-10-CM

## 2020-11-22 IMAGING — MR MR HIP*R* W/CM
4 of 6 series · 16 of 40 positions shown · IV contrast (agent unspecified)
Comparison: X-ray [DATE]

CLINICAL DATA: Chronic right hip pain.  Suspect labral tear.

EXAM:
MRI OF THE RIGHT HIP WITH CONTRAST (MR Arthrogram)
TECHNIQUE: Multiplanar, multisequence MR imaging of the hip was performed
immediately following contrast injection into the hip joint under
fluoroscopic guidance. No intravenous contrast was administered.

[Series 3: T1 · coronal · 4.0mm · 0.53mm/px · 6 of 24 slices shown]
[im 1/24]
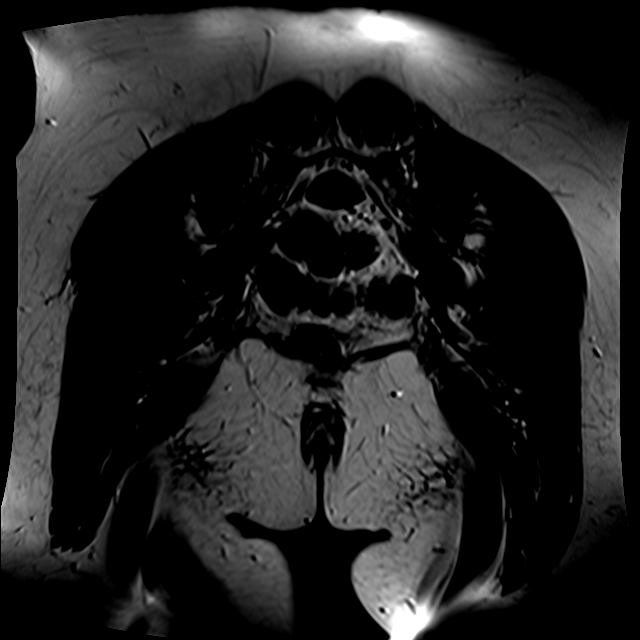
[im 5/24]
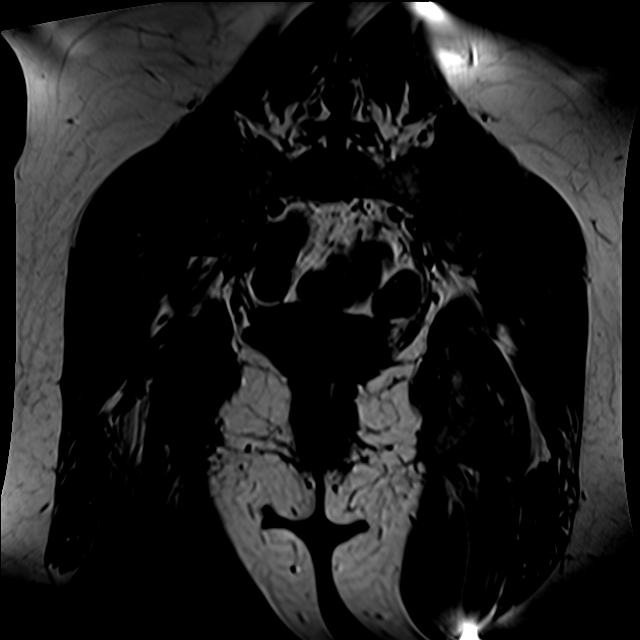
[im 10/24]
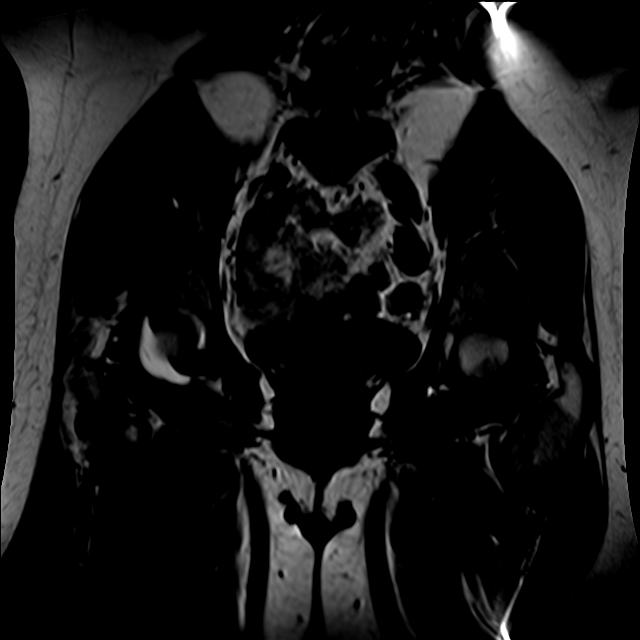
[im 14/24]
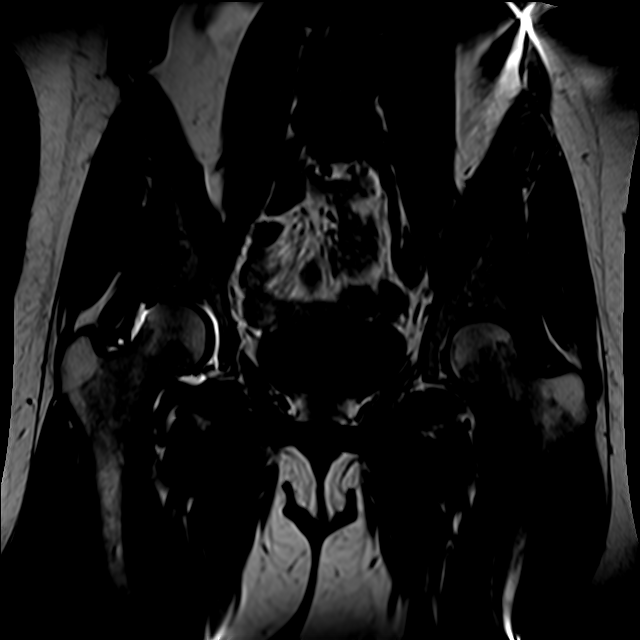
[im 19/24]
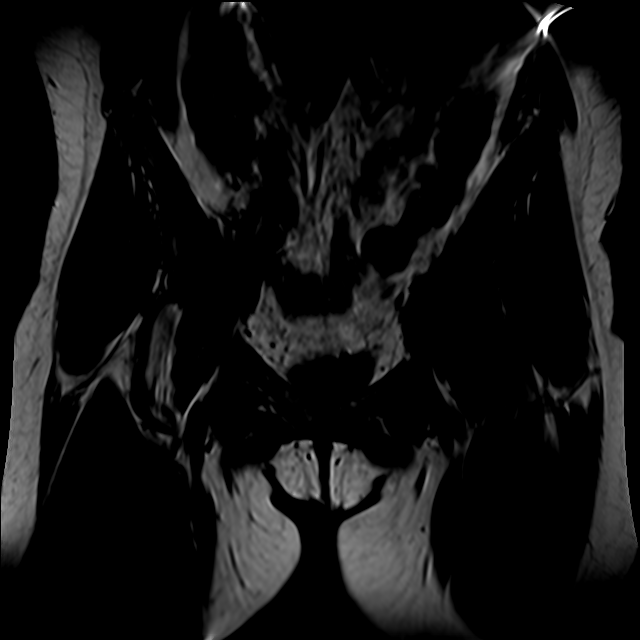
[im 24/24]
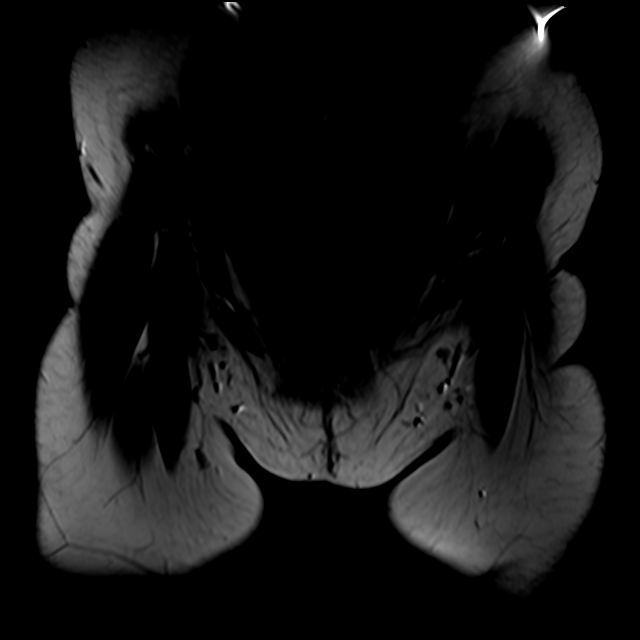

[Series 4: T2 fat-sat · coronal · 4.0mm · 0.53mm/px · 4 of 24 slices shown (1 of 2)]
[im 1/24]
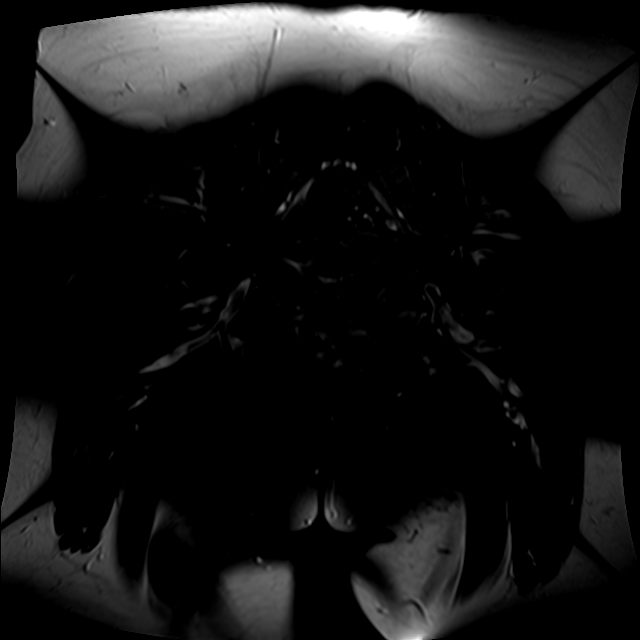
[im 4/24]
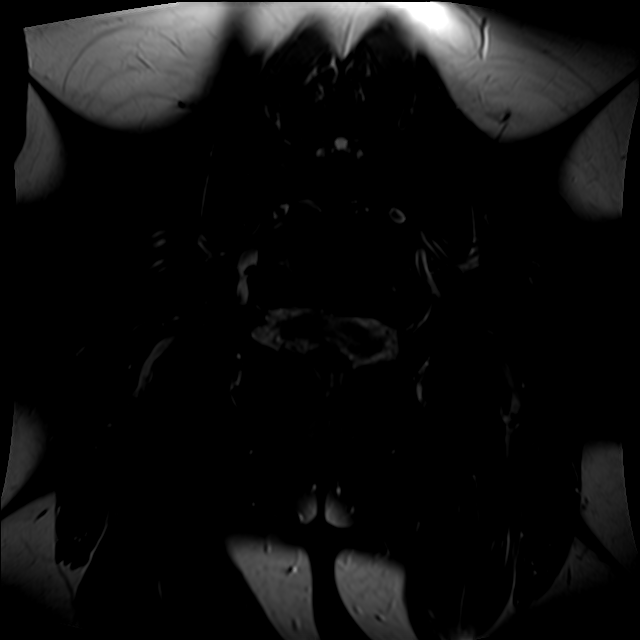
[im 12/24]
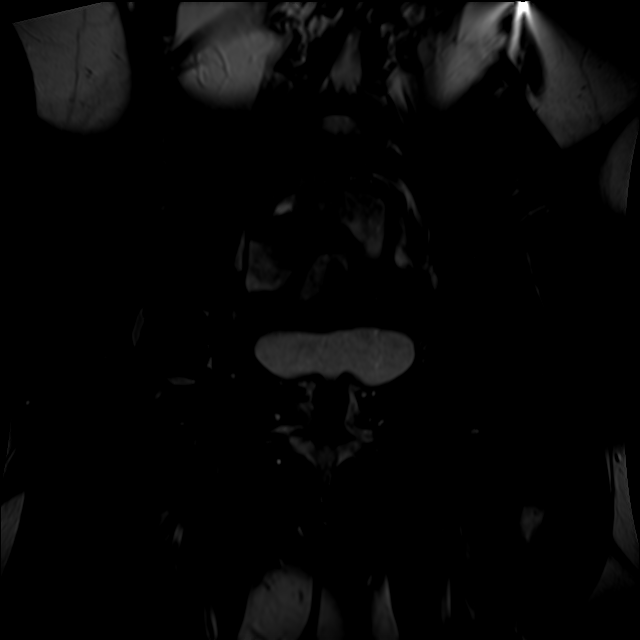
[im 20/24]
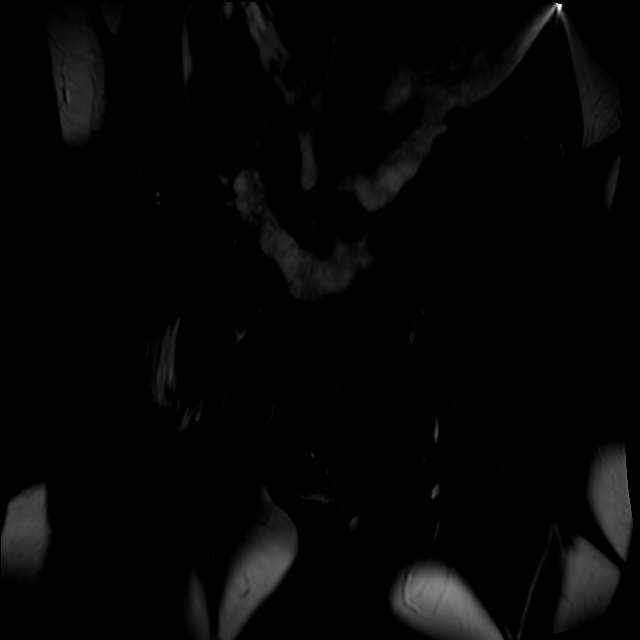

[Series 5: T2 fat-sat · axial · 4.0mm · 0.70mm/px · z∈[-91,+8]mm · 3 of 29 slices shown (2 of 2)]
[im 5/29]
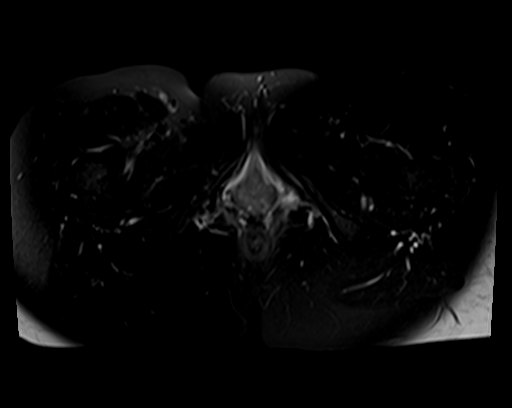
[im 17/29]
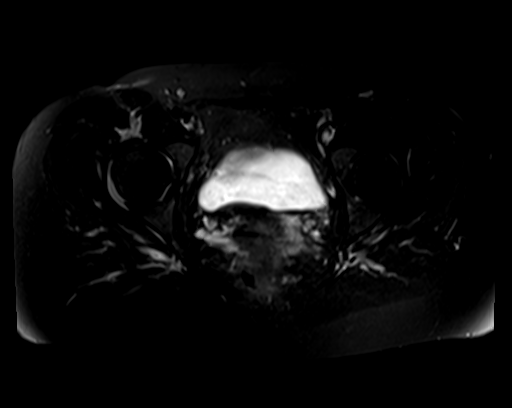
[im 25/29]
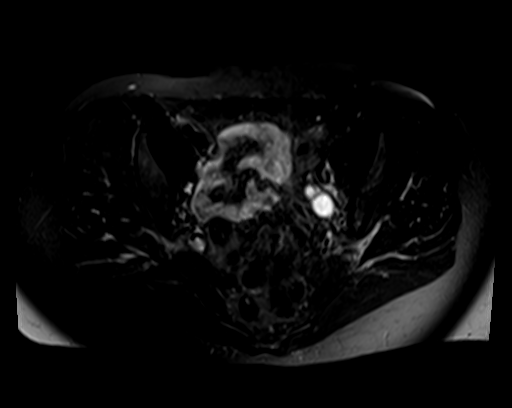

[Series 6: T1 fat-sat · axial · 4.0mm · 0.70mm/px · z∈[-103,-39]mm · 3 of 22 slices shown]
[im 5/22]
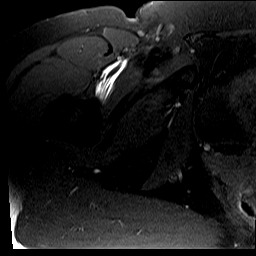
[im 13/22]
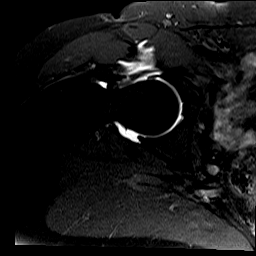
[im 22/22]
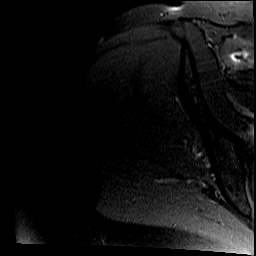

[16 of 40 positions shown; findings below may reference images not displayed]

FINDINGS: Bones: No acute fracture. No dislocation. No femoral head avascular
necrosis. Unremarkable osseous morphology of the proximal femurs.
Bony pelvis intact. SI joints and pubic symphysis within normal
limits. Suspect transitional lumbosacral anatomy. Visualized lower
lumbar disc heights are preserved. No bone marrow edema. No marrow
replacing bone lesion.

Articular cartilage and labrum

Articular cartilage: No cartilage defect. No subchondral marrow
signal changes.

Labrum: No labral tear is identified.  No paralabral cyst.

Joint or bursal effusion

Joint effusion: Joint is adequately distended with injected
contrast. A small amount of extracapsular contrast anteriorly is
favored to be injection related.

Bursae: No abnormal bursal fluid collection.

Muscles and tendons

Muscles and tendons: The gluteal, hamstring, iliopsoas, rectus
femoris, and adductor tendons appear intact without tear or
significant tendinosis. Normal muscle bulk and signal intensity
without edema, atrophy, or fatty infiltration.

Other findings

Miscellaneous: No inguinal lymphadenopathy. No acute findings are
seen within the pelvis.
IMPRESSION: 1. Unremarkable MR arthrogram of the right hip. No labral tear is
identified.
2. Intact right hip tendons.
3. Suspect transitional lumbosacral anatomy.

## 2020-11-22 IMAGING — XA DG FLUORO GUIDE NDL PLC/BX
2 series · 2 of 2 positions shown · non-contrast
Comparison: none

CLINICAL DATA: Right hip pain.

[Series 1: ortho adipose · 1 of 1 slices shown (1 of 2)]
[im 1/1]
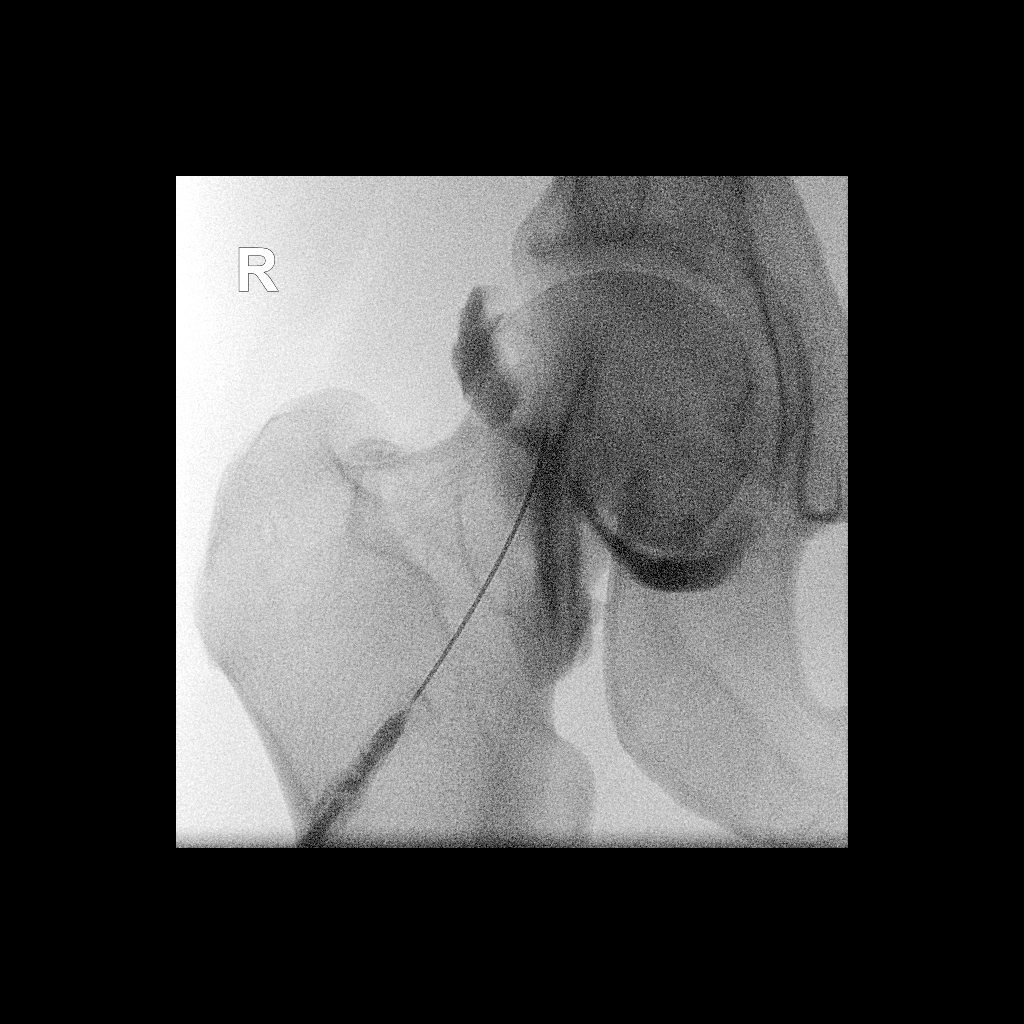

[Series 2: ortho adipose · 1 of 1 slices shown (2 of 2)]
[im 1/1]
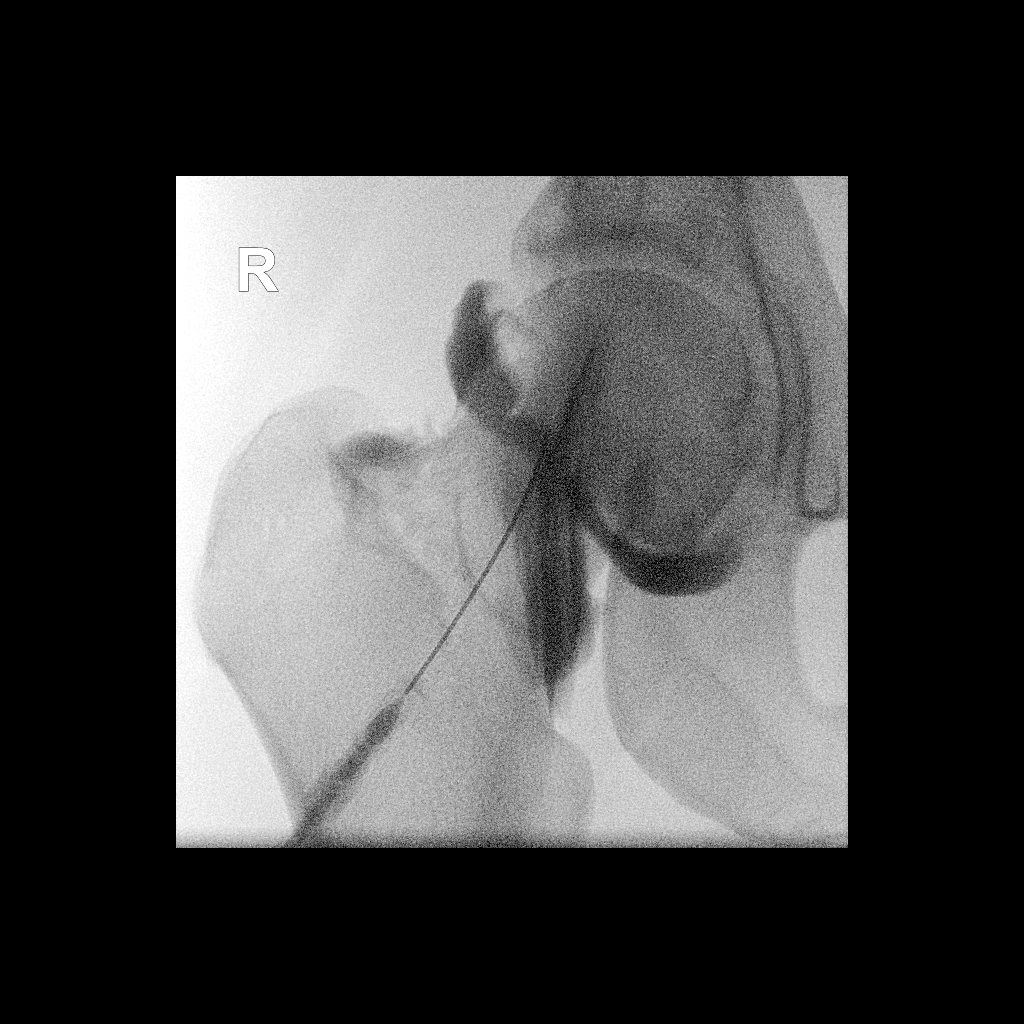

[2 of 2 positions shown; findings below may reference images not displayed]

FLUOROSCOPY TIME:  Radiation Exposure Index (as provided by the
fluoroscopic device): 13.38 uGy*m2

PROCEDURE:
Right HIP INJECTION UNDER FLUOROSCOPY

An appropriate skin entrance site was determined. The site was
marked, prepped with Betadine, draped in the usual sterile fashion,
and infiltrated locally with 1% Lidocaine. A 22 gauge spinal needle
was advanced to the superolateral margin of the femoral head under
intermittent fluoroscopy. 1 ml of Lidocaine injected easily. A
mixture of 0.1 ml Multihance in 20 ml of dilute Isovue M 200 was
then used to opacify the right hip joint. No immediate complication.
IMPRESSION: Technically successful right hip injection for MRI.

## 2020-11-22 MED ORDER — IOPAMIDOL (ISOVUE-M 200) INJECTION 41%
12.0000 mL | Freq: Once | INTRAMUSCULAR | Status: AC
  Administered 2020-11-22: 12 mL via INTRA_ARTICULAR

## 2020-11-27 ENCOUNTER — Ambulatory Visit: Payer: BC Managed Care – PPO | Admitting: Orthopaedic Surgery

## 2020-11-27 ENCOUNTER — Encounter: Payer: Self-pay | Admitting: Orthopaedic Surgery

## 2020-11-27 DIAGNOSIS — M25551 Pain in right hip: Secondary | ICD-10-CM

## 2020-11-27 NOTE — Progress Notes (Signed)
The patient comes in today to go over MRI of her right hip.  She is only 40 years old and very active and has had different hip issues for last 13 years that are not severe but they are affecting her actives daily living and her quality of life.  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  The MRI is reviewed with her and it shows a normal-appearing right hip which is comforting for her and for me.  There is no evidence of a labral tear.  This was a MRI arthroscopy.  The cartilage looks well-preserved.  There is no evidence of any tendon issues or muscle issues around the hip itself or pelvis.  I gave reassurance that she can continue her activities as she tolerates.  She states that she will work with a Systems analyst to look at her exercise routine for any recommendations of her form and function.  All questions and concerns were answered addressed.  Follow-up can be as needed.

## 2022-10-29 NOTE — Telephone Encounter (Signed)
Error
